# Patient Record
Sex: Female | Born: 1990
Health system: Southern US, Community
[De-identification: ages and names within clinical notes are randomized; demographics above are authoritative.]

## PROBLEM LIST (undated history)

## (undated) ENCOUNTER — Inpatient Hospital Stay (HOSPITAL_COMMUNITY): Payer: Self-pay

## (undated) DIAGNOSIS — F419 Anxiety disorder, unspecified: Secondary | ICD-10-CM

## (undated) DIAGNOSIS — F322 Major depressive disorder, single episode, severe without psychotic features: Secondary | ICD-10-CM

## (undated) DIAGNOSIS — K219 Gastro-esophageal reflux disease without esophagitis: Secondary | ICD-10-CM

## (undated) HISTORY — PX: NO PAST SURGERIES: SHX2092

---

## 2014-08-19 ENCOUNTER — Emergency Department (HOSPITAL_COMMUNITY)
Admission: EM | Admit: 2014-08-19 | Discharge: 2014-08-20 | Disposition: A | Discharge status: Home / Self Care | Payer: Medicaid Other | Attending: Emergency Medicine | Admitting: Emergency Medicine

## 2014-08-19 ENCOUNTER — Encounter (HOSPITAL_COMMUNITY): Payer: Self-pay | Admitting: Emergency Medicine

## 2014-08-19 DIAGNOSIS — Y92410 Unspecified street and highway as the place of occurrence of the external cause: Secondary | ICD-10-CM | POA: Insufficient documentation

## 2014-08-19 DIAGNOSIS — Z3202 Encounter for pregnancy test, result negative: Secondary | ICD-10-CM | POA: Diagnosis not present

## 2014-08-19 DIAGNOSIS — Y9389 Activity, other specified: Secondary | ICD-10-CM | POA: Insufficient documentation

## 2014-08-19 DIAGNOSIS — S29002A Unspecified injury of muscle and tendon of back wall of thorax, initial encounter: Secondary | ICD-10-CM | POA: Insufficient documentation

## 2014-08-19 DIAGNOSIS — Y998 Other external cause status: Secondary | ICD-10-CM | POA: Diagnosis not present

## 2014-08-19 HISTORY — DX: Major depressive disorder, single episode, severe without psychotic features: F32.2

## 2014-08-19 HISTORY — DX: Anxiety disorder, unspecified: F41.9

## 2014-08-19 LAB — POC URINE PREG, ED: PREG TEST UR: NEGATIVE

## 2014-08-19 MED ORDER — IBUPROFEN 400 MG PO TABS
800.0000 mg | ORAL_TABLET | Freq: Once | ORAL | Status: AC
  Administered 2014-08-19: 800 mg via ORAL
  Filled 2014-08-19: qty 2
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
## 2014-08-20 ENCOUNTER — Emergency Department (HOSPITAL_COMMUNITY): Payer: Medicaid Other

## 2014-08-20 MED ORDER — IBUPROFEN 800 MG PO TABS: 800.0000 mg | ORAL_TABLET | Freq: Three times a day (TID) | ORAL | Status: DC

## 2014-08-20 MED ORDER — METHOCARBAMOL 500 MG PO TABS: 500.0000 mg | ORAL_TABLET | Freq: Two times a day (BID) | ORAL | Status: DC

## 2014-08-20 NOTE — Discharge Instructions (Signed)

## 2014-12-22 ENCOUNTER — Inpatient Hospital Stay (HOSPITAL_COMMUNITY): Payer: Medicaid Other

## 2014-12-22 ENCOUNTER — Inpatient Hospital Stay (HOSPITAL_COMMUNITY)
Admission: AD | Admit: 2014-12-22 | Discharge: 2014-12-22 | Disposition: A | Discharge status: Home / Self Care | Payer: Medicaid Other | Source: Ambulatory Visit | Attending: Family Medicine | Admitting: Family Medicine

## 2014-12-22 DIAGNOSIS — R1030 Lower abdominal pain, unspecified: Secondary | ICD-10-CM | POA: Insufficient documentation

## 2014-12-22 DIAGNOSIS — O210 Mild hyperemesis gravidarum: Secondary | ICD-10-CM | POA: Insufficient documentation

## 2014-12-22 DIAGNOSIS — O4691 Antepartum hemorrhage, unspecified, first trimester: Secondary | ICD-10-CM | POA: Diagnosis not present

## 2014-12-22 DIAGNOSIS — Z3A09 9 weeks gestation of pregnancy: Secondary | ICD-10-CM | POA: Insufficient documentation

## 2014-12-22 DIAGNOSIS — O209 Hemorrhage in early pregnancy, unspecified: Secondary | ICD-10-CM

## 2014-12-22 LAB — URINE MICROSCOPIC-ADD ON

## 2014-12-22 LAB — URINALYSIS, ROUTINE W REFLEX MICROSCOPIC
Bilirubin Urine: NEGATIVE
GLUCOSE, UA: NEGATIVE mg/dL
Hgb urine dipstick: NEGATIVE
Ketones, ur: NEGATIVE mg/dL
Nitrite: NEGATIVE
PH: 5.5 (ref 5.0–8.0)
Protein, ur: NEGATIVE mg/dL
Specific Gravity, Urine: 1.025 (ref 1.005–1.030)
Urobilinogen, UA: 0.2 mg/dL (ref 0.0–1.0)

## 2014-12-22 LAB — CBC
HCT: 37.1 % (ref 36.0–46.0)
Hemoglobin: 12.9 g/dL (ref 12.0–15.0)
MCH: 32.1 pg (ref 26.0–34.0)
MCHC: 34.8 g/dL (ref 30.0–36.0)
MCV: 92.3 fL (ref 78.0–100.0)
Platelets: 269 10*3/uL (ref 150–400)
RBC: 4.02 MIL/uL (ref 3.87–5.11)
RDW: 13 % (ref 11.5–15.5)
WBC: 7.5 10*3/uL (ref 4.0–10.5)

## 2014-12-22 LAB — POCT PREGNANCY, URINE: Preg Test, Ur: POSITIVE — AB

## 2014-12-22 LAB — HCG, QUANTITATIVE, PREGNANCY: hCG, Beta Chain, Quant, S: 232 m[IU]/mL — ABNORMAL HIGH (ref <5)

## 2014-12-22 LAB — ABO/RH: ABO/RH(D): O POS

## 2014-12-22 NOTE — MAU Provider Note (Signed)
History     CSN: [id number]  Arrival date and time: 12/22/14 1247   None     Chief Complaint  Patient presents with  . Vaginal Bleeding  . Abdominal Pain   HPI This is a 24 y.o. female at [redacted]w[redacted]d by LMP who presents with c/o irregular cycles, possible + UPT at home, nausea, vomiting and one episode of bleeding a week ago. N/V is present daily. Intermittent. Has had dull lower abdominal pain for 3 days. Does not radiate.   RN Note:  Expand All Collapse All   LMP end of January, hx of irregular periods. Did HPT, thinks it was positive but not sure.  C/O N&V, had episode of bleeding last week - doesn't think it was her period, has light bleeding today. Lower abd pain x 3 days.           OB History    Gravida Para Term Preterm AB TAB SAB Ectopic Multiple Living   1               Past Medical History  Diagnosis Date  . Anxiety   . Severe depression     No past surgical history on file.  No family history on file.  History  Substance Use Topics  . Smoking status: Never Smoker   . Smokeless tobacco: Not on file  . Alcohol Use: No    Allergies: No Known Allergies  Prescriptions prior to admission  Medication Sig Dispense Refill Last Dose  . ibuprofen (ADVIL,MOTRIN) 800 MG tablet Take 1 tablet (800 mg total) by mouth 3 (three) times daily. 21 tablet 0   . methocarbamol (ROBAXIN) 500 MG tablet Take 1 tablet (500 mg total) by mouth 2 (two) times daily. 20 tablet 0     Review of Systems  Constitutional: Negative for fever, chills and malaise/fatigue.  Gastrointestinal: Positive for nausea, vomiting and abdominal pain. Negative for diarrhea and constipation.  Genitourinary: Negative for dysuria.       Vaginal bleeding   Neurological: Negative for dizziness, weakness and headaches.  Psychiatric/Behavioral: Positive for depression.   Physical Exam   Blood pressure 118/80, pulse 70, temperature 97.5 F (36.4 C), temperature source Oral, resp. rate 18, height 4'  11" (1.499 m), weight 128 lb 9.6 oz (58.333 kg), last menstrual period 10/17/2014.  Physical Exam  Constitutional: She is oriented to person, place, and time. She appears well-developed and well-nourished. No distress.  HENT:  Head: Normocephalic.  Cardiovascular: Normal rate.   Respiratory: Effort normal.  GI: Soft. She exhibits no distension. There is no tenderness. There is no rebound and no guarding.  Genitourinary:  Would not consent to pelvic exam today. Agrees to "maybe" do it when she returns for next Quant HCG level  Musculoskeletal: Normal range of motion.  Neurological: She is alert and oriented to person, place, and time.  Skin: Skin is warm and dry.  Psychiatric: She has a normal mood and affect.   Results for orders placed or performed during the hospital encounter of 12/22/14 (from the past 72 hour(s))  Urinalysis, Routine w reflex microscopic     Status: Abnormal   Collection Time: 12/22/14  1:55 PM  Result Value Ref Range   Color, Urine YELLOW YELLOW   APPearance CLEAR CLEAR   Specific Gravity, Urine 1.025 1.005 - 1.030   pH 5.5 5.0 - 8.0   Glucose, UA NEGATIVE NEGATIVE mg/dL   Hgb urine dipstick NEGATIVE NEGATIVE   Bilirubin Urine NEGATIVE NEGATIVE   Ketones, ur  NEGATIVE NEGATIVE mg/dL   Protein, ur NEGATIVE NEGATIVE mg/dL   Urobilinogen, UA 0.2 0.0 - 1.0 mg/dL   Nitrite NEGATIVE NEGATIVE   Leukocytes, UA SMALL (A) NEGATIVE  Urine microscopic-add on     Status: Abnormal   Collection Time: 12/22/14  1:55 PM  Result Value Ref Range   Squamous Epithelial / LPF FEW (A) RARE   WBC, UA 0-2 <3 WBC/hpf   Bacteria, UA RARE RARE  Pregnancy, urine POC     Status: Abnormal   Collection Time: 12/22/14  2:06 PM  Result Value Ref Range   Preg Test, Ur POSITIVE (A) NEGATIVE    Comment:        THE SENSITIVITY OF THIS METHODOLOGY IS >24 mIU/mL   CBC     Status: None   Collection Time: 12/22/14  3:30 PM  Result Value Ref Range   WBC 7.5 4.0 - 10.5 K/uL   RBC 4.02 3.87  - 5.11 MIL/uL   Hemoglobin 12.9 12.0 - 15.0 g/dL   HCT 16.137.1 09.636.0 - 04.546.0 %   MCV 92.3 78.0 - 100.0 fL   MCH 32.1 26.0 - 34.0 pg   MCHC 34.8 30.0 - 36.0 g/dL   RDW 40.913.0 81.111.5 - 91.415.5 %   Platelets 269 150 - 400 K/uL  hCG, quantitative, pregnancy     Status: Abnormal   Collection Time: 12/22/14  3:30 PM  Result Value Ref Range   hCG, Beta Chain, Quant, S 232 (H) <5 mIU/mL    Comment:          GEST. AGE      CONC.  (mIU/mL)   <=1 WEEK        5 - 50     2 WEEKS       50 - 500     3 WEEKS       100 - 10,000     4 WEEKS     1,000 - 30,000     5 WEEKS     3,500 - 115,000   6-8 WEEKS     12,000 - 270,000    12 WEEKS     15,000 - 220,000        FEMALE AND NON-PREGNANT FEMALE:     LESS THAN 5 mIU/mL   ABO/Rh     Status: None   Collection Time: 12/22/14  3:30 PM  Result Value Ref Range   ABO/RH(D) O POS     MAU Course  Procedures  MDM Discussed process of ruling out ectopic pregnancy. Discussed need to do pelvic exam., refuses to do it today. Wants to leave. Encouraged to return in 2 days for repeat HCG  Assessment and Plan  A:  Pregnancy at 8127w4d       Abdominal pain      Bleeding episode a week ago      Nausea and vomiting  P:  States she Will return Thursday for repeat quant and pelvic exam   Medication List    STOP taking these medications        ibuprofen 800 MG tablet  Commonly known as:  ADVIL,MOTRIN     methocarbamol 500 MG tablet  Commonly known as:  ROBAXIN               Ectopic precautions.   Wynelle BourgeoisWILLIAMS,Jolleen Seman 12/22/2014, 5:14 PM

## 2014-12-22 NOTE — MAU Note (Signed)
LMP end of January, hx of irregular periods.  Did HPT, thinks it was positive but not sure.   C/O N&V, had episode of bleeding last week - doesn't think it was her period, has light bleeding today.  Lower abd pain x 3 days.

## 2014-12-22 NOTE — Discharge Instructions (Signed)
Pelvic Rest °Pelvic rest is sometimes recommended for women when:  °· The placenta is partially or completely covering the opening of the cervix (placenta previa). °· There is bleeding between the uterine wall and the amniotic sac in the first trimester (subchorionic hemorrhage). °· The cervix begins to open without labor starting (incompetent cervix, cervical insufficiency). °· The labor is too early (preterm labor). °HOME CARE INSTRUCTIONS °· Do not have sexual intercourse, stimulation, or an orgasm. °· Do not use tampons, douche, or put anything in the vagina. °· Do not lift anything over 10 pounds (4.5 kg). °· Avoid strenuous activity or straining your pelvic muscles. °SEEK MEDICAL CARE IF:  °· You have any vaginal bleeding during pregnancy. Treat this as a potential emergency. °· You have cramping pain felt low in the stomach (stronger than menstrual cramps). °· You notice vaginal discharge (watery, mucus, or bloody). °· You have a low, dull backache. °· There are regular contractions or uterine tightening. °SEEK IMMEDIATE MEDICAL CARE IF: °You have vaginal bleeding and have placenta previa.  °Document Released: 12/30/2010 Document Revised: 11/27/2011 Document Reviewed: 12/30/2010 °ExitCare® Patient Information ©2015 [employer]. This information is not intended to replace advice given to you by your health care provider. Make sure you discuss any questions you have with your health care provider. ° °Vaginal Bleeding During Pregnancy, First Trimester °A small amount of bleeding (spotting) from the vagina is common in early pregnancy. Sometimes the bleeding is normal and is not a problem, and sometimes it is a sign of something serious. Be sure to tell your doctor about any bleeding from your vagina right away. °HOME CARE °· Watch your condition for any changes. °· Follow your doctor's instructions about how active you can be. °· If you are on bed rest: °¨ You may need to stay in bed and only get up to use the  bathroom. °¨ You may be allowed to do some activities. °¨ If you need help, make plans for someone to help you. °· Write down: °¨ The number of pads you use each day. °¨ How often you change pads. °¨ How soaked (saturated) your pads are. °· Do not use tampons. °· Do not douche. °· Do not have sex or orgasms until your doctor says it is okay. °· If you pass any tissue from your vagina, save the tissue so you can show it to your doctor. °· Only take medicines as told by your doctor. °· Do not take aspirin because it can make you bleed. °· Keep all follow-up visits as told by your doctor. °GET HELP IF:  °· You bleed from your vagina. °· You have cramps. °· You have labor pains. °· You have a fever that does not go away after you take medicine. °GET HELP RIGHT AWAY IF:  °· You have very bad cramps in your back or belly (abdomen). °· You pass large clots or tissue from your vagina. °· You bleed more. °· You feel light-headed or weak. °· You pass out (faint). °· You have chills. °· You are leaking fluid or have a gush of fluid from your vagina. °· You pass out while pooping (having a bowel movement). °MAKE SURE YOU: °· Understand these instructions. °· Will watch your condition. °· Will get help right away if you are not doing well or get worse. °Document Released: 01/19/2014 Document Reviewed: 05/12/2013 °ExitCare® Patient Information ©2015 [employer]. This information is not intended to replace advice given to you by your health care provider. Make   sure you discuss any questions you have with your health care provider. ° °

## 2014-12-23 ENCOUNTER — Encounter (HOSPITAL_COMMUNITY): Payer: Self-pay | Admitting: Advanced Practice Midwife

## 2014-12-25 ENCOUNTER — Telehealth (HOSPITAL_COMMUNITY): Payer: Self-pay | Admitting: Emergency Medicine

## 2014-12-25 NOTE — Telephone Encounter (Signed)
Spoke with pt regarding follow up and missed appt.  ? Whether she needed to return, has to work, may come on Monday. Informed pt we are open 24/7, just want to make sure she is ok.

## 2015-10-28 ENCOUNTER — Encounter (HOSPITAL_COMMUNITY): Payer: Self-pay | Admitting: *Deleted

## 2016-01-15 ENCOUNTER — Encounter (HOSPITAL_COMMUNITY): Payer: Self-pay

## 2016-01-15 ENCOUNTER — Ambulatory Visit (HOSPITAL_COMMUNITY)
Admission: EM | Admit: 2016-01-15 | Discharge: 2016-01-15 | Disposition: A | Discharge status: Home / Self Care | Payer: Medicaid Other | Attending: Emergency Medicine | Admitting: Emergency Medicine

## 2016-01-15 DIAGNOSIS — T7840XA Allergy, unspecified, initial encounter: Secondary | ICD-10-CM | POA: Diagnosis not present

## 2016-01-15 DIAGNOSIS — L509 Urticaria, unspecified: Secondary | ICD-10-CM

## 2016-01-15 MED ORDER — PREDNISONE 50 MG PO TABS
ORAL_TABLET | ORAL | Status: DC
Start: 2016-01-15 — End: 2016-09-17

## 2016-01-15 MED ORDER — METHYLPREDNISOLONE SODIUM SUCC 125 MG IJ SOLR
125.0000 mg | Freq: Once | INTRAMUSCULAR | Status: AC
  Administered 2016-01-15: 125 mg via INTRAMUSCULAR

## 2016-01-15 MED ORDER — METHYLPREDNISOLONE SODIUM SUCC 125 MG IJ SOLR
INTRAMUSCULAR | Status: AC
  Filled 2016-01-15: qty 2

## 2016-01-15 NOTE — ED Provider Notes (Signed)
CSN: [id number]     Arrival date & time 01/15/16  1840 History   First MD Initiated Contact with Patient 01/15/16 1906     Chief Complaint  Patient presents with  . Rash   (Consider location/radiation/quality/duration/timing/severity/associated sxs/prior Treatment) HPI  He is a 25 year old woman here for evaluation of rash. She states around 4:00 this afternoon she developed itching on her right posterior shoulder. This spread to her arms, chest, and back. She reports getting hives that have since gone down. She has taken Benadryl with some improvement. She continues to feel very itchy. No known new exposures.  Past Medical History  Diagnosis Date  . Anxiety   . Severe depression    History reviewed. No pertinent past surgical history. No family history on file. Social History  Substance Use Topics  . Smoking status: Current Every Day Smoker -- 1.00 packs/day    Types: Cigarettes  . Smokeless tobacco: Never Used  . Alcohol Use: No   OB History    Gravida Para Term Preterm AB TAB SAB Ectopic Multiple Living   1              Review of Systems As in history of present illness Allergies  Review of patient's allergies indicates no known allergies.  Home Medications   Prior to Admission medications   Medication Sig Start Date End Date Taking? Authorizing Provider  predniSONE (DELTASONE) 50 MG tablet Take 1 pill daily for 5 days. 01/15/16   Charm RingsErin J Jag Lenz, MD   Meds Ordered and Administered this Visit   Medications  methylPREDNISolone sodium succinate (SOLU-MEDROL) 125 mg/2 mL injection 125 mg (125 mg Intramuscular Given 01/15/16 1950)    BP 109/69 mmHg  Pulse 74  Temp(Src) 97.9 F (36.6 C) (Oral)  SpO2 100% No data found.   Physical Exam  Constitutional: She is oriented to person, place, and time. She appears well-developed and well-nourished. No distress.  HENT:  Mouth/Throat: Oropharynx is clear and moist. No oropharyngeal exudate.  Cardiovascular: Normal rate,  regular rhythm and normal heart sounds.   No murmur heard. Pulmonary/Chest: Effort normal and breath sounds normal. No respiratory distress. She has no wheezes. She has no rales.  Neurological: She is alert and oriented to person, place, and time.  Skin:  She does have an urticarial type rash on her back.    ED Course  Procedures (including critical care time)  Labs Review Labs Reviewed - No data to display  Imaging Review No results found.   MDM   1. Urticaria   2. Allergic reaction, initial encounter    Unclear trigger. Solu-Medrol given here. Treat with prednisone and OTC allergy medicine. Benadryl as needed for itching. Follow-up as needed.    Charm RingsErin J Kimmerly Lora, MD 01/15/16 [phone]

## 2016-01-15 NOTE — ED Notes (Signed)
Patient states she broke out in a rash all over both arms and her back today at work, she is not sure what she came into contact with, thinks she may be allergic to fish.  Patient used benadryl spray and itching stopped. No acute distress

## 2016-01-15 NOTE — Discharge Instructions (Signed)
You are having a reaction to something, I'm not sure what, but I don't think it's the fish. We gave you a shot today. Start the prednisone tomorrow. Take Zyrtec or Claritin daily for the next week. You can use Benadryl every 4 hours as needed for itching. Follow-up as needed.

## 2016-06-21 ENCOUNTER — Emergency Department (HOSPITAL_COMMUNITY)
Admission: EM | Admit: 2016-06-21 | Discharge: 2016-06-22 | Disposition: A | Discharge status: Home / Self Care | Payer: Medicaid Other | Attending: Emergency Medicine | Admitting: Emergency Medicine

## 2016-06-21 ENCOUNTER — Encounter (HOSPITAL_COMMUNITY): Payer: Self-pay | Admitting: *Deleted

## 2016-06-21 ENCOUNTER — Encounter (HOSPITAL_COMMUNITY): Payer: Self-pay | Admitting: Emergency Medicine

## 2016-06-21 ENCOUNTER — Ambulatory Visit (HOSPITAL_COMMUNITY)
Admission: EM | Admit: 2016-06-21 | Discharge: 2016-06-21 | Disposition: A | Discharge status: Home / Self Care | Payer: Medicaid Other | Attending: Family Medicine | Admitting: Family Medicine

## 2016-06-21 DIAGNOSIS — F17210 Nicotine dependence, cigarettes, uncomplicated: Secondary | ICD-10-CM | POA: Diagnosis not present

## 2016-06-21 DIAGNOSIS — R52 Pain, unspecified: Secondary | ICD-10-CM | POA: Diagnosis not present

## 2016-06-21 DIAGNOSIS — R1084 Generalized abdominal pain: Secondary | ICD-10-CM

## 2016-06-21 DIAGNOSIS — Z79899 Other long term (current) drug therapy: Secondary | ICD-10-CM | POA: Insufficient documentation

## 2016-06-21 DIAGNOSIS — R110 Nausea: Secondary | ICD-10-CM

## 2016-06-21 DIAGNOSIS — R21 Rash and other nonspecific skin eruption: Secondary | ICD-10-CM | POA: Diagnosis not present

## 2016-06-21 DIAGNOSIS — B019 Varicella without complication: Secondary | ICD-10-CM | POA: Diagnosis not present

## 2016-06-21 LAB — COMPREHENSIVE METABOLIC PANEL
ALBUMIN: 3.9 g/dL (ref 3.5–5.0)
ALT: 25 U/L (ref 14–54)
AST: 24 U/L (ref 15–41)
Alkaline Phosphatase: 69 U/L (ref 38–126)
Anion gap: 7 (ref 5–15)
BILIRUBIN TOTAL: 0.5 mg/dL (ref 0.3–1.2)
BUN: 8 mg/dL (ref 6–20)
CO2: 26 mmol/L (ref 22–32)
Calcium: 9.2 mg/dL (ref 8.9–10.3)
Chloride: 103 mmol/L (ref 101–111)
Creatinine, Ser: 0.86 mg/dL (ref 0.44–1.00)
GFR calc Af Amer: >60 mL/min (ref >60)
GFR calc non Af Amer: >60 mL/min (ref >60)
GLUCOSE: 96 mg/dL (ref 65–99)
POTASSIUM: 3.9 mmol/L (ref 3.5–5.1)
SODIUM: 136 mmol/L (ref 135–145)
TOTAL PROTEIN: 6.1 g/dL — AB (ref 6.5–8.1)

## 2016-06-21 LAB — CBC
HEMATOCRIT: 37.7 % (ref 36.0–46.0)
HEMOGLOBIN: 12.7 g/dL (ref 12.0–15.0)
MCH: 31.4 pg (ref 26.0–34.0)
MCHC: 33.7 g/dL (ref 30.0–36.0)
MCV: 93.3 fL (ref 78.0–100.0)
Platelets: 225 10*3/uL (ref 150–400)
RBC: 4.04 MIL/uL (ref 3.87–5.11)
RDW: 12.9 % (ref 11.5–15.5)
WBC: 3.6 10*3/uL — ABNORMAL LOW (ref 4.0–10.5)

## 2016-06-21 LAB — URINALYSIS, ROUTINE W REFLEX MICROSCOPIC
GLUCOSE, UA: NEGATIVE mg/dL
HGB URINE DIPSTICK: NEGATIVE
KETONES UR: 15 mg/dL — AB
Leukocytes, UA: NEGATIVE
NITRITE: NEGATIVE
PH: 7 (ref 5.0–8.0)
Protein, ur: NEGATIVE mg/dL
SPECIFIC GRAVITY, URINE: 1.043 — AB (ref 1.005–1.030)

## 2016-06-21 LAB — POCT URINALYSIS DIP (DEVICE)
Glucose, UA: NEGATIVE mg/dL
HGB URINE DIPSTICK: NEGATIVE
KETONES UR: 40 mg/dL — AB
Leukocytes, UA: NEGATIVE
NITRITE: NEGATIVE
Protein, ur: 30 mg/dL — AB
Specific Gravity, Urine: 1.025 (ref 1.005–1.030)
Urobilinogen, UA: 2 mg/dL — ABNORMAL HIGH (ref 0.0–1.0)
pH: 6 (ref 5.0–8.0)

## 2016-06-21 LAB — POC URINE PREG, ED: Preg Test, Ur: NEGATIVE

## 2016-06-21 LAB — LIPASE, BLOOD: Lipase: 20 U/L (ref 11–51)

## 2016-06-21 LAB — POCT PREGNANCY, URINE: Preg Test, Ur: NEGATIVE

## 2016-06-21 MED ORDER — TRIAMCINOLONE ACETONIDE 0.1 % EX CREA: 1.0000 "application " | TOPICAL_CREAM | Freq: Two times a day (BID) | CUTANEOUS | 0 refills | Status: AC

## 2016-06-21 MED ORDER — ONDANSETRON 4 MG PO TBDP: 4.0000 mg | ORAL_TABLET | Freq: Three times a day (TID) | ORAL | 0 refills | Status: AC | PRN

## 2016-06-21 MED ORDER — ONDANSETRON 4 MG PO TBDP
4.0000 mg | ORAL_TABLET | Freq: Once | ORAL | Status: AC
  Administered 2016-06-21: 4 mg via ORAL

## 2016-06-21 MED ORDER — ONDANSETRON 4 MG PO TBDP
ORAL_TABLET | ORAL | Status: AC
  Filled 2016-06-21: qty 1

## 2016-06-21 NOTE — ED Provider Notes (Signed)
CSN: [id number]     Arrival date & time 06/21/16  1223 History   First MD Initiated Contact with Patient 06/21/16 1419     Chief Complaint  Patient presents with  . Abdominal Pain   (Consider location/radiation/quality/duration/timing/severity/associated sxs/prior Treatment) Kelsey Cooper is a well-appearing 25 y.o female, presents today with multiple complaints. She started feeling sick 2 days ago and reports not able to eat and haven't been eating anything for the past 2 days. She is able to keep fluid down. Today she started to get worst with body ache, hurting everywhere including her back and her flanks, with chills, nausea and vomited x 1 episode today. Emesis contains food content (crackers that she just ate). Emesis non-bloody, non-bilious, and non-projectile. She also noticed several small bumps on her body. She reports to have intermittent throbbing abdominal pain locates at the mid abdomen and eventually traveled to her private area and reports that this pain went away and is currently having no abd pain. She reports no vaginal discharge. She reports no fever. She reports no dysuria. She is sexually active. LMP: finished 2 days ago. LBM: yesterday and denies constipation. She is passing gas.       Past Medical History:  Diagnosis Date  . Anxiety   . Severe depression (HCC)    History reviewed. No pertinent surgical history. History reviewed. No pertinent family history. Social History  Substance Use Topics  . Smoking status: Current Every Day Smoker    Packs/day: 1.00    Types: Cigarettes  . Smokeless tobacco: Never Used  . Alcohol use No   OB History    Gravida Para Term Preterm AB Living   1             SAB TAB Ectopic Multiple Live Births                 Review of Systems  Constitutional: Negative for appetite change, chills, fatigue and fever.  HENT: Negative for congestion, rhinorrhea, sinus pressure and sore throat.   Respiratory: Negative for cough and shortness  of breath.   Cardiovascular: Negative for chest pain and palpitations.  Gastrointestinal: Positive for abdominal pain, nausea and vomiting. Negative for blood in stool, constipation and diarrhea.       Abdominal pain has subsidized  Genitourinary: Positive for flank pain. Negative for dysuria and vaginal discharge.  Musculoskeletal: Positive for myalgias.  Skin: Positive for rash.  Neurological: Negative for dizziness, weakness and headaches.    Allergies  Review of patient's allergies indicates no known allergies.  Home Medications   Prior to Admission medications   Medication Sig Start Date End Date Taking? Authorizing Provider  ondansetron (ZOFRAN ODT) 4 MG disintegrating tablet Take 1 tablet (4 mg total) by mouth every 8 (eight) hours as needed for nausea or vomiting. 06/21/16 06/24/16  Lucia Estelle, NP  predniSONE (DELTASONE) 50 MG tablet Take 1 pill daily for 5 days. 01/15/16   Charm Rings, MD  triamcinolone cream (KENALOG) 0.1 % Apply 1 application topically 2 (two) times daily. 06/21/16 06/26/16  Lucia Estelle, NP   Meds Ordered and Administered this Visit   Medications  ondansetron (ZOFRAN-ODT) disintegrating tablet 4 mg (4 mg Oral Given 06/21/16 1417)    BP 114/63 (BP Location: Left Arm)   Pulse 70   Temp 98 F (36.7 C) (Oral)   Resp 12   LMP 06/19/2016 (Exact Date)   SpO2 100%  No data found.   Physical Exam  Constitutional: She is oriented  to person, place, and time. She appears well-developed and well-nourished.  HENT:  Head: Normocephalic and atraumatic.  Right Ear: External ear normal.  Left Ear: External ear normal.  Nose: Nose normal.  Mouth/Throat: Oropharynx is clear and moist. No oropharyngeal exudate.  Eyes: EOM are normal. Pupils are equal, round, and reactive to light.  Neck: Normal range of motion. Neck supple.  Cardiovascular: Normal rate, regular rhythm and normal heart sounds.   Pulmonary/Chest: Effort normal and breath sounds normal. No respiratory  distress.  Abdominal: Soft. Bowel sounds are normal. She exhibits no distension and no mass. There is no tenderness.  Musculoskeletal: Normal range of motion. She exhibits no edema or deformity.  Lymphadenopathy:    She has no cervical adenopathy.  Neurological: She is alert and oriented to person, place, and time.  Skin: Skin is warm and dry.  Has several discrete and scattered erythematous vesicles, one noted on forehead, one noted on right breast, two noted on her left upper thigh near the groin. Noticed the rash today. Rash non-bothersome, no pain, no itchiness. See picture below  Psychiatric: She has a normal mood and affect.  Nursing note and vitals reviewed.         Urgent Care Course   Clinical Course    Procedures (including critical care time)  Labs Review Labs Reviewed  POCT URINALYSIS DIP (DEVICE) - Abnormal; Notable for the following:       Result Value   Bilirubin Urine SMALL (*)    Ketones, ur 40 (*)    Protein, ur 30 (*)    Urobilinogen, UA 2.0 (*)    All other components within normal limits  POCT PREGNANCY, URINE    Imaging Review No results found.    MDM   1. Nausea   2. Generalized abdominal pain   3. Body aches   4. Rash    UA negative for nitrite and leukocytes but pos for bilirubin, ketones, protein and urobilinogen; informed to follow up with PCP in 1-2 weeks to have urine recheck. Informed that her symptoms today are very unspecific cause is unknown but could be viral, however I am not concern for an emergency causes. Rash believes to be cause by a separate etiology. Zofran given for nausea. Topical steroid cream given for the rash. Supervising physician consulted for this visit. Instructed to must f/u with PCP.    Lucia EstelleFeng Glendi Mohiuddin, NP 06/21/16 1529

## 2016-06-21 NOTE — Discharge Instructions (Signed)
The cause of your symptoms is unknown. We believe you are safe to be discharge home. Please take Zofran as needed for your  nausea. Start a soft bland diet to help as well. Apply the cream on rash twice daily for 5 days. Follow up with your primary care provider in 1-2 weeks for your urine to be recheck. I provided you a copy of your urine result so you can take it to your primary care doctor.

## 2016-06-21 NOTE — ED Triage Notes (Signed)
[facility] with multiple complaints. The patient stated that she has been having suprapubic pain along with chills, body aches, and a loss of appetite for 2 days.   The patient also reported a rash on her face and head.

## 2016-06-21 NOTE — ED Triage Notes (Signed)
The pt is c/o  Generalized body aches chills and sweats for 3 days back pain  Rash.  She denies urnary symptoms  [facility] and was told that they could not find anything.  She reports that she cannot eat also  lmp 2 days ago  No pain or discomfort at present

## 2016-06-22 MED ORDER — ACYCLOVIR 400 MG PO TABS: 800.0000 mg | ORAL_TABLET | Freq: Every day | ORAL | 0 refills | Status: DC

## 2016-06-22 MED ORDER — PROMETHAZINE HCL 25 MG PO TABS: 25.0000 mg | ORAL_TABLET | Freq: Three times a day (TID) | ORAL | 0 refills | Status: DC | PRN

## 2016-06-22 NOTE — ED Provider Notes (Signed)
[facility]-EMERGENCY DEPT Provider Note   CSN: [id number] Arrival date & time: 06/21/16  1958     History   Chief Complaint Chief Complaint  Patient presents with  . Generalized Body Aches    HPI Kelsey Cooper is a 25 y.o. female.  The history is provided by the patient. No language interpreter was used.   Kelsey Cooper is a 25 y.o. female who presents to the Emergency Department complaining of body aches.  She reports 3 days of generalized body aches with intermittent abdominal pain, nausea, back pain. She reports subjective fevers at home with decreased oral intake and some vomiting that started today. No dysuria, vaginal discharge, diarrhea. She noted bumpy rash today with minimal itching. She recently found out that a coworker was diagnosed with chickenpox and is out of work. No cough or shortness of breath.  Past Medical History:  Diagnosis Date  . Anxiety   . Severe depression (HCC)     There are no active problems to display for this patient.   History reviewed. No pertinent surgical history.  OB History    Gravida Para Term Preterm AB Living   1             SAB TAB Ectopic Multiple Live Births                   Home Medications    Prior to Admission medications   Medication Sig Start Date End Date Taking? Authorizing Provider  acyclovir (ZOVIRAX) 400 MG tablet Take 2 tablets (800 mg total) by mouth 5 (five) times daily. 06/22/16   Tilden Fossa, MD  ondansetron (ZOFRAN ODT) 4 MG disintegrating tablet Take 1 tablet (4 mg total) by mouth every 8 (eight) hours as needed for nausea or vomiting. 06/21/16 06/24/16  Lucia Estelle, NP  predniSONE (DELTASONE) 50 MG tablet Take 1 pill daily for 5 days. 01/15/16   Charm Rings, MD  promethazine (PHENERGAN) 25 MG tablet Take 1 tablet (25 mg total) by mouth every 8 (eight) hours as needed for nausea or vomiting. 06/22/16   Tilden Fossa, MD  triamcinolone cream (KENALOG) 0.1 % Apply 1 application topically 2 (two) times daily.  06/21/16 06/26/16  Lucia Estelle, NP    Family History No family history on file.  Social History Social History  Substance Use Topics  . Smoking status: Current Every Day Smoker    Packs/day: 1.00    Types: Cigarettes  . Smokeless tobacco: Never Used  . Alcohol use No     Allergies   Review of patient's allergies indicates no known allergies.   Review of Systems Review of Systems  All other systems reviewed and are negative.    Physical Exam Updated Vital Signs BP 122/61 (BP Location: Right Arm)   Pulse 82   Temp 98.7 F (37.1 C) (Oral)   Resp 18   LMP 06/19/2016 (Exact Date)   SpO2 99%   Physical Exam  Constitutional: She is oriented to person, place, and time. She appears well-developed and well-nourished.  HENT:  Head: Normocephalic and atraumatic.  Cardiovascular: Normal rate and regular rhythm.   No murmur heard. Pulmonary/Chest: Effort normal and breath sounds normal. No respiratory distress.  Abdominal: Soft. There is no tenderness. There is no rebound and no guarding.  Musculoskeletal: She exhibits no edema or tenderness.  Neurological: She is alert and oriented to person, place, and time.  Skin: Skin is warm and dry.  Vesicular rash in various stages on head, right neck, back,  chest and abdomen, upper legs.  Psychiatric: She has a normal mood and affect. Her behavior is normal.  Nursing note and vitals reviewed.    ED Treatments / Results  Labs (all labs ordered are listed, but only abnormal results are displayed) Labs Reviewed  COMPREHENSIVE METABOLIC PANEL - Abnormal; Notable for the following:       Result Value   Total Protein 6.1 (*)    All other components within normal limits  CBC - Abnormal; Notable for the following:    WBC 3.6 (*)    All other components within normal limits  URINALYSIS, ROUTINE W REFLEX MICROSCOPIC (NOT AT [facility]) - Abnormal; Notable for the following:    Color, Urine AMBER (*)    APPearance CLOUDY (*)    Specific  Gravity, Urine 1.043 (*)    Bilirubin Urine SMALL (*)    Ketones, ur 15 (*)    All other components within normal limits  LIPASE, BLOOD  POC URINE PREG, ED    EKG  EKG Interpretation None       Radiology No results found.  Procedures Procedures (including critical care time)  Medications Ordered in ED Medications - No data to display   Initial Impression / Assessment and Plan / ED Course  I have reviewed the triage vital signs and the nursing notes.  Pertinent labs & imaging results that were available during my care of the patient were reviewed by me and considered in my medical decision making (see chart for details).  Clinical Course    Patient here for evaluation of body aches, nausea, decreased oral intake. Examination is concerning for varicella infection. She is nontoxic appearing on examination. Discussed home care as well as infection control precautions. Discussed outpatient follow-up and return precautions.  Final Clinical Impressions(s) / ED Diagnoses   Final diagnoses:  Varicella without complication    New Prescriptions Discharge Medication List as of 06/22/2016 12:08 AM    START taking these medications   Details  acyclovir (ZOVIRAX) 400 MG tablet Take 2 tablets (800 mg total) by mouth 5 (five) times daily., Starting Thu 06/22/2016, Print    promethazine (PHENERGAN) 25 MG tablet Take 1 tablet (25 mg total) by mouth every 8 (eight) hours as needed for nausea or vomiting., Starting Thu 06/22/2016, Print         Tilden FossaElizabeth Edwyna Dangerfield, MD 06/22/16 0145

## 2016-09-17 ENCOUNTER — Inpatient Hospital Stay (HOSPITAL_COMMUNITY)
Admission: AD | Admit: 2016-09-17 | Discharge: 2016-09-17 | Disposition: A | Discharge status: Home / Self Care | Payer: Medicaid Other | Source: Ambulatory Visit | Attending: Obstetrics and Gynecology | Admitting: Obstetrics and Gynecology

## 2016-09-17 ENCOUNTER — Encounter (HOSPITAL_COMMUNITY): Payer: Self-pay

## 2016-09-17 DIAGNOSIS — Z3A01 Less than 8 weeks gestation of pregnancy: Secondary | ICD-10-CM | POA: Diagnosis not present

## 2016-09-17 DIAGNOSIS — R12 Heartburn: Secondary | ICD-10-CM | POA: Diagnosis present

## 2016-09-17 DIAGNOSIS — O219 Vomiting of pregnancy, unspecified: Secondary | ICD-10-CM | POA: Insufficient documentation

## 2016-09-17 DIAGNOSIS — O26891 Other specified pregnancy related conditions, first trimester: Secondary | ICD-10-CM

## 2016-09-17 DIAGNOSIS — Z87891 Personal history of nicotine dependence: Secondary | ICD-10-CM | POA: Insufficient documentation

## 2016-09-17 HISTORY — DX: Gastro-esophageal reflux disease without esophagitis: K21.9

## 2016-09-17 LAB — COMPREHENSIVE METABOLIC PANEL
ALBUMIN: 3.6 g/dL (ref 3.5–5.0)
ALT: 15 U/L (ref 14–54)
ANION GAP: 6 (ref 5–15)
AST: 13 U/L — ABNORMAL LOW (ref 15–41)
Alkaline Phosphatase: 47 U/L (ref 38–126)
BUN: 13 mg/dL (ref 6–20)
CO2: 24 mmol/L (ref 22–32)
Calcium: 8.5 mg/dL — ABNORMAL LOW (ref 8.9–10.3)
Chloride: 101 mmol/L (ref 101–111)
Creatinine, Ser: 0.59 mg/dL (ref 0.44–1.00)
GFR calc non Af Amer: >60 mL/min (ref >60)
GLUCOSE: 83 mg/dL (ref 65–99)
Potassium: 3.6 mmol/L (ref 3.5–5.1)
SODIUM: 131 mmol/L — AB (ref 135–145)
Total Bilirubin: 0.6 mg/dL (ref 0.3–1.2)
Total Protein: 5.8 g/dL — ABNORMAL LOW (ref 6.5–8.1)

## 2016-09-17 LAB — URINALYSIS, ROUTINE W REFLEX MICROSCOPIC
BILIRUBIN URINE: NEGATIVE
Glucose, UA: NEGATIVE mg/dL
HGB URINE DIPSTICK: NEGATIVE
Ketones, ur: 20 mg/dL — AB
NITRITE: NEGATIVE
PROTEIN: 30 mg/dL — AB
SPECIFIC GRAVITY, URINE: 1.028 (ref 1.005–1.030)
pH: 7 (ref 5.0–8.0)

## 2016-09-17 LAB — CBC
HEMATOCRIT: 36.7 % (ref 36.0–46.0)
HEMOGLOBIN: 13.1 g/dL (ref 12.0–15.0)
MCH: 31.9 pg (ref 26.0–34.0)
MCHC: 35.7 g/dL (ref 30.0–36.0)
MCV: 89.3 fL (ref 78.0–100.0)
Platelets: 310 10*3/uL (ref 150–400)
RBC: 4.11 MIL/uL (ref 3.87–5.11)
RDW: 12.8 % (ref 11.5–15.5)
WBC: 9.1 10*3/uL (ref 4.0–10.5)

## 2016-09-17 LAB — LIPASE, BLOOD: Lipase: 14 U/L (ref 11–51)

## 2016-09-17 LAB — POCT PREGNANCY, URINE: PREG TEST UR: POSITIVE — AB

## 2016-09-17 MED ORDER — PANTOPRAZOLE SODIUM 40 MG IV SOLR
40.0000 mg | Freq: Once | INTRAVENOUS | Status: AC
  Administered 2016-09-17: 40 mg via INTRAVENOUS
  Filled 2016-09-17: qty 40

## 2016-09-17 MED ORDER — LACTATED RINGERS IV BOLUS (SEPSIS)
1000.0000 mL | Freq: Once | INTRAVENOUS | Status: AC
  Administered 2016-09-17: 1000 mL via INTRAVENOUS

## 2016-09-17 MED ORDER — PROMETHAZINE HCL 25 MG/ML IJ SOLN
25.0000 mg | Freq: Once | INTRAMUSCULAR | Status: AC
  Administered 2016-09-17: 25 mg via INTRAVENOUS
  Filled 2016-09-17: qty 1

## 2016-09-17 MED ORDER — RANITIDINE HCL 150 MG PO TABS: 150.0000 mg | ORAL_TABLET | Freq: Two times a day (BID) | ORAL | 0 refills | Status: DC

## 2016-09-17 NOTE — MAU Note (Signed)
Patient just found out pregnant, diagnosed with GERD, can't keep anything down, has not eaten in 3 days, taking Pepcid

## 2016-09-17 NOTE — Discharge Instructions (Signed)
Food Choices for Gastroesophageal Reflux Disease, Adult When you have gastroesophageal reflux disease (GERD), the foods you eat and your eating habits are very important. Choosing the right foods can help ease your discomfort. What guidelines do I need to follow?  Choose fruits, vegetables, whole grains, and low-fat dairy products.  Choose low-fat meat, fish, and poultry.  Limit fats such as oils, salad dressings, butter, nuts, and avocado.  Keep a food diary. This helps you identify foods that cause symptoms.  Avoid foods that cause symptoms. These may be different for everyone.  Eat small meals often instead of 3 large meals a day.  Eat your meals slowly, in a place where you are relaxed.  Limit fried foods.  Cook foods using methods other than frying.  Avoid drinking alcohol.  Avoid drinking large amounts of liquids with your meals.  Avoid bending over or lying down until 2-3 hours after eating. What foods are not recommended? These are some foods and drinks that may make your symptoms worse: Vegetables  Tomatoes. Tomato juice. Tomato and spaghetti sauce. Chili peppers. Onion and garlic. Horseradish. Fruits  Oranges, grapefruit, and lemon (fruit and juice). Meats  High-fat meats, fish, and poultry. This includes hot dogs, ribs, ham, sausage, salami, and bacon. Dairy  Whole milk and chocolate milk. Sour cream. Cream. Butter. Ice cream. Cream cheese. Drinks  Coffee and tea. Bubbly (carbonated) drinks or energy drinks. Condiments  Hot sauce. Barbecue sauce. Sweets/Desserts  Chocolate and cocoa. Donuts. Peppermint and spearmint. Fats and Oils  High-fat foods. This includes [place] fries and potato chips. Other  Vinegar. Strong spices. This includes black pepper, white pepper, red pepper, cayenne, curry powder, cloves, ginger, and chili powder. The items listed above may not be a complete list of foods and drinks to avoid. Contact your dietitian for more information.   This information is not intended to replace advice given to you by your health care provider. Make sure you discuss any questions you have with your health care provider. Document Released: 03/05/2012 Document Revised: 02/10/2016 Document Reviewed: 07/09/2013 Elsevier Interactive Patient Education  2017 Elsevier Inc.  Morning Sickness Morning sickness is when you feel sick to your stomach (nauseous) during pregnancy. This nauseous feeling may or may not come with vomiting. It often occurs in the morning but can be a problem any time of day. Morning sickness is most common during the first trimester, but it may continue throughout pregnancy. While morning sickness is unpleasant, it is usually harmless unless you develop severe and continual vomiting (hyperemesis gravidarum). This condition requires more intense treatment. What are the causes? The cause of morning sickness is not completely known but seems to be related to normal hormonal changes that occur in pregnancy. What increases the risk? You are at greater risk if you:  Experienced nausea or vomiting before your pregnancy.  Had morning sickness during a previous pregnancy.  Are pregnant with more than one baby, such as twins. How is this treated? Do not use any medicines (prescription, over-the-counter, or herbal) for morning sickness without first talking to your health care provider. Your health care provider may prescribe or recommend:  Vitamin B6 supplements.  Anti-nausea medicines.  The herbal medicine ginger. Follow these instructions at home:  Only take over-the-counter or prescription medicines as directed by your health care provider.  Taking multivitamins before getting pregnant can prevent or decrease the severity of morning sickness in most women.  Eat a piece of dry toast or unsalted crackers before getting out of bed  in the morning.  Eat five or six small meals a day.  Eat dry and bland foods (rice, baked  potato). Foods high in carbohydrates are often helpful.  Do not drink liquids with your meals. Drink liquids between meals.  Avoid greasy, fatty, and spicy foods.  Get someone to cook for you if the smell of any food causes nausea and vomiting.  If you feel nauseous after taking prenatal vitamins, take the vitamins at night or with a snack.  Snack on protein foods (nuts, yogurt, cheese) between meals if you are hungry.  Eat unsweetened gelatins for desserts.  Wearing an acupressure wristband (worn for sea sickness) may be helpful.  Acupuncture may be helpful.  Do not smoke.  Get a humidifier to keep the air in your house free of odors.  Get plenty of fresh air. Contact a health care provider if:  Your home remedies are not working, and you need medicine.  You feel dizzy or lightheaded.  You are losing weight. Get help right away if:  You have persistent and uncontrolled nausea and vomiting.  You pass out (faint). This information is not intended to replace advice given to you by your health care provider. Make sure you discuss any questions you have with your health care provider. Document Released: 10/26/2006 Document Revised: 02/10/2016 Document Reviewed: 02/19/2013 Elsevier Interactive Patient Education  2017 Elsevier Inc.  Heartburn During Pregnancy Heartburn is pain or discomfort in the throat or chest. It may cause a burning feeling. It happens when stomach acid moves up into the tube that carries food from your mouth to your stomach (esophagus). Heartburn is common during pregnancy. It usually goes away or gets better after giving birth. Follow these instructions at home: Eating and drinking  Do not drink alcohol while you are pregnant.  Figure out which foods and beverages make you feel worse, and avoid them.  Beverages that you may want to avoid include:  Coffee and tea (with or without caffeine).  Energy drinks and sports drinks.  Bubbly (carbonated)  drinks or sodas.  Citrus fruit juices.  Foods that you may want to avoid include:  Chocolate and cocoa.  Peppermint and mint flavorings.  Garlic, onions, and horseradish.  Spicy and acidic foods. These include peppers, chili powder, curry powder, vinegar, hot sauces, and barbecue sauce.  Citrus fruits, such as oranges, lemons, and limes.  Tomato-based foods, such as red sauce, chili, and salsa.  Fried and fatty foods, such as donuts, french fries, potato chips, and high-fat dressings.  High-fat meats, such as hot dogs, cold cuts, sausage, ham, and bacon.  High-fat dairy items, such as whole milk, butter, and cheese.  Eat small meals often, instead of large meals.  Avoid drinking a lot of liquid with your meals.  Avoid eating meals during the 2-3 hours before you go to bed.  Avoid lying down right after you eat.  Do not exercise right after you eat. Medicines  Take over-the-counter and prescription medicines only as told by your doctor.  Do not take aspirin, ibuprofen, or other NSAIDs unless your doctor tells you to do that.  Your doctor may tell you to avoid medicines that have sodium bicarbonate in them. General instructions  If told, raise the head of your bed about 6 inches (15 cm). You can do this by putting blocks under the legs. Sleeping with more pillows does not help with heartburn.  Do not use any products that contain nicotine or tobacco, such as cigarettes and e-cigarettes.  If you need help quitting, ask your doctor.  Wear loose-fitting clothing.  Try to lower your stress, such as with yoga or meditation. If you need help, ask your doctor.  Stay at a healthy weight. If you are overweight, work with your doctor to safely lose weight.  Keep all follow-up visits as told by your doctor. This is important. Contact a doctor if:  You get new symptoms.  Your symptoms do not get better with treatment.  You have weight loss and you do not know why.  You  have trouble swallowing.  You make loud sounds when you breathe (wheeze).  You have a cough that does not go away.  You have heartburn often for more than 2 weeks.  You feel sick to your stomach (nauseous), and this does not get better with treatment.  You are throwing up (vomiting), and this does not get better with treatment.  You have pain in your belly (abdomen). Get help right away if:  You have very bad chest pain that spreads to your arm, neck, or jaw.  You feel sweaty, dizzy, or light-headed.  You have trouble breathing.  You have pain when swallowing.  You throw up and your throw-up looks like blood or coffee grounds.  Your poop (stool) is bloody or black. This information is not intended to replace advice given to you by your health care provider. Make sure you discuss any questions you have with your health care provider. Document Released: 10/07/2010 Document Revised: 05/22/2016 Document Reviewed: 05/22/2016 Elsevier Interactive Patient Education  2017 Elsevier Inc.         [facility]   Francoise CeoBernard Marshall      Phone: [phone]  [facility]     Phone: [phone]  [facility] [place] [facility]  Phone: [phone]  [facility] [place]  Phone: [phone]  [facility]    Phone: [phone]   [facility]    Phone: [phone]  [facility]    Phone: [phone]  [facility]    Phone: [phone]  [facility]    Phone: [phone]  [facility] Phone: [phone]   [facility]  Phone: [phone]  [facility]    Phone: [phone]  [facility]   Phone: [phone]  [facility]      Phone: [phone]  [facility]    Phone: [phone]  [facility]     Phone: [phone]

## 2016-09-17 NOTE — MAU Provider Note (Signed)
History     CSN: [id number]  Arrival date and time: 09/17/16 1625   First Provider Initiated Contact with Patient 09/17/16 1656      Chief Complaint  Patient presents with  . Heartburn  . Emesis During Pregnancy   HPI  Kelsey Cooper is a 25 y.o. [id number] at [id number] who presents with epigastric pain & n/v. [place] & diagnosed with GERD; started on pepcid but reports no improvement in symptoms. Epigastric pain has worsened. Describes as constant burning pain that makes her not want to eat. Rates pain 9/10. Taking pepcid without relief. Nausea & vomiting throughout pregnancy. States was mostly dry heaving today and vomited once. Attempted to ear rice krispy treat this morning but unable to keep it down. Also unable to keep down water or apple juice. Denies lower abdominal pain, vaginal bleeding, vaginal discharge, diarrhea, constipation, fever/chills, or dysuria.   OB History    Gravida Para Term Preterm AB Living   5 2 1 1 2 2    SAB TAB Ectopic Multiple Live Births   1 1     2       Past Medical History:  Diagnosis Date  . Anxiety   . GERD (gastroesophageal reflux disease)   . Severe depression (HCC)     Past Surgical History:  Procedure Laterality Date  . NO PAST SURGERIES      No family history on file.  Social History  Substance Use Topics  . Smoking status: Former Smoker    Packs/day: 1.00    Types: Cigarettes  . Smokeless tobacco: Former NeurosurgeonUser  . Alcohol use No    Allergies: No Known Allergies  Prescriptions Prior to Admission  Medication Sig Dispense Refill Last Dose  . acyclovir (ZOVIRAX) 400 MG tablet Take 2 tablets (800 mg total) by mouth 5 (five) times daily. 70 tablet 0   . predniSONE (DELTASONE) 50 MG tablet Take 1 pill daily for 5 days. 5 tablet 0   . promethazine (PHENERGAN) 25 MG tablet Take 1 tablet (25 mg total) by mouth every 8 (eight) hours as needed for nausea or vomiting. 30 tablet 0     Review of Systems   Constitutional: Negative.   Gastrointestinal: Positive for heartburn, nausea and vomiting. Negative for abdominal pain, constipation and diarrhea.  Genitourinary: Negative.    Physical Exam   Blood pressure 120/72, pulse 81, temperature 98 F (36.7 C), temperature source Oral, resp. rate 16, height 4\' 11"  (1.499 m), weight 127 lb (57.6 kg), last menstrual period 07/19/2016, unknown if currently breastfeeding.  Physical Exam  Nursing note and vitals reviewed. Constitutional: She is oriented to person, place, and time. She appears well-developed and well-nourished. No distress.  HENT:  Head: Normocephalic and atraumatic.  Eyes: Conjunctivae are normal. Right eye exhibits no discharge. Left eye exhibits no discharge. No scleral icterus.  Neck: Normal range of motion.  Cardiovascular: Normal rate, regular rhythm and normal heart sounds.   No murmur heard. Respiratory: Effort normal and breath sounds normal. No respiratory distress. She has no wheezes.  GI: Soft. Bowel sounds are normal. She exhibits no distension. There is no tenderness. There is no rebound.  Neurological: She is alert and oriented to person, place, and time.  Skin: Skin is warm and dry. She is not diaphoretic.  Psychiatric: She has a normal mood and affect. Her behavior is normal. Judgment and thought content normal.    MAU Course  Procedures Results for orders placed or performed  during the hospital encounter of 09/17/16 (from the past 24 hour(s))  Urinalysis, Routine w reflex microscopic     Status: Abnormal   Collection Time: 09/17/16  4:35 PM  Result Value Ref Range   Color, Urine YELLOW YELLOW   APPearance CLOUDY (A) CLEAR   Specific Gravity, Urine 1.028 1.005 - 1.030   pH 7.0 5.0 - 8.0   Glucose, UA NEGATIVE NEGATIVE mg/dL   Hgb urine dipstick NEGATIVE NEGATIVE   Bilirubin Urine NEGATIVE NEGATIVE   Ketones, ur 20 (A) NEGATIVE mg/dL   Protein, ur 30 (A) NEGATIVE mg/dL   Nitrite NEGATIVE NEGATIVE    Leukocytes, UA SMALL (A) NEGATIVE   RBC / HPF 0-5 0 - 5 RBC/hpf   WBC, UA 6-30 0 - 5 WBC/hpf   Bacteria, UA RARE (A) NONE SEEN   Squamous Epithelial / LPF 6-30 (A) NONE SEEN   Mucous PRESENT   Pregnancy, urine POC     Status: Abnormal   Collection Time: 09/17/16  4:55 PM  Result Value Ref Range   Preg Test, Ur POSITIVE (A) NEGATIVE  CBC     Status: None   Collection Time: 09/17/16  5:40 PM  Result Value Ref Range   WBC 9.1 4.0 - 10.5 K/uL   RBC 4.11 3.87 - 5.11 MIL/uL   Hemoglobin 13.1 12.0 - 15.0 g/dL   HCT 09.836.7 11.936.0 - 14.746.0 %   MCV 89.3 78.0 - 100.0 fL   MCH 31.9 26.0 - 34.0 pg   MCHC 35.7 30.0 - 36.0 g/dL   RDW 82.912.8 56.211.5 - 13.015.5 %   Platelets 310 150 - 400 K/uL  Lipase, blood     Status: None   Collection Time: 09/17/16  5:40 PM  Result Value Ref Range   Lipase 14 11 - 51 U/L  Comprehensive metabolic panel     Status: Abnormal   Collection Time: 09/17/16  6:27 PM  Result Value Ref Range   Sodium 131 (L) 135 - 145 mmol/L   Potassium 3.6 3.5 - 5.1 mmol/L   Chloride 101 101 - 111 mmol/L   CO2 24 22 - 32 mmol/L   Glucose, Bld 83 65 - 99 mg/dL   BUN 13 6 - 20 mg/dL   Creatinine, Ser 8.650.59 0.44 - 1.00 mg/dL   Calcium 8.5 (L) 8.9 - 10.3 mg/dL   Total Protein 5.8 (L) 6.5 - 8.1 g/dL   Albumin 3.6 3.5 - 5.0 g/dL   AST 13 (L) 15 - 41 U/L   ALT 15 14 - 54 U/L   Alkaline Phosphatase 47 38 - 126 U/L   Total Bilirubin 0.6 0.3 - 1.2 mg/dL   GFR calc non Af Amer >60 >60 mL/min   GFR calc Af Amer >60 >60 mL/min   Anion gap 6 5 - 15    MDM CBC, CMP, lipase IV LR bolus Phenergan 25 mg & protonix 40 mg IV Pt sleeping in room during reassessment -- not observed vomiting during MAU visit  Assessment and Plan  A; 1. Nausea and vomiting during pregnancy prior to [redacted] weeks gestation   2. Heartburn during pregnancy in first trimester    P: Discharge home D/c pepcid Rx Zantac Continue phenergan as previously prescribed Discussed reasons to return to MAU Keep f/u with OB  Judeth HornErin  Marlee Trentman 09/17/2016, 4:55 PM

## 2016-09-18 NOTE — L&D Delivery Note (Signed)
Delivery Note Patient had a precipitous delivery in the bed approximately 5:25 PM a viable and healthy female was delivered via Vaginal, Spontaneous Delivery  APGAR:9/9 ,weight pending   Placenta delivered at bedside status: intact 3 vessels spontaneous Anesthesia:  Epidural Episiotomy:  n/a Lacerations:  none Suture Repair: n/a Est. Blood Loss (mL):  250 mL  Mom to postpartum.  Baby to Couplet care / Skin to Skin.  Essie HartINN, Kairen Hallinan STACIA 04/27/2017, 5:58 PM

## 2016-09-19 ENCOUNTER — Encounter (HOSPITAL_COMMUNITY): Payer: Self-pay | Admitting: Advanced Practice Midwife

## 2016-09-19 ENCOUNTER — Inpatient Hospital Stay (HOSPITAL_COMMUNITY)
Admission: AD | Admit: 2016-09-19 | Discharge: 2016-09-19 | Disposition: A | Discharge status: Home / Self Care | Payer: Medicaid Other | Source: Ambulatory Visit | Attending: Obstetrics & Gynecology | Admitting: Obstetrics & Gynecology

## 2016-09-19 DIAGNOSIS — R42 Dizziness and giddiness: Secondary | ICD-10-CM | POA: Insufficient documentation

## 2016-09-19 DIAGNOSIS — O26891 Other specified pregnancy related conditions, first trimester: Secondary | ICD-10-CM | POA: Insufficient documentation

## 2016-09-19 DIAGNOSIS — Z87891 Personal history of nicotine dependence: Secondary | ICD-10-CM | POA: Insufficient documentation

## 2016-09-19 DIAGNOSIS — O219 Vomiting of pregnancy, unspecified: Secondary | ICD-10-CM | POA: Diagnosis not present

## 2016-09-19 DIAGNOSIS — Z3A01 Less than 8 weeks gestation of pregnancy: Secondary | ICD-10-CM | POA: Diagnosis not present

## 2016-09-19 DIAGNOSIS — R109 Unspecified abdominal pain: Secondary | ICD-10-CM | POA: Diagnosis present

## 2016-09-19 MED ORDER — PROMETHAZINE HCL 25 MG PO TABS: 25.0000 mg | ORAL_TABLET | Freq: Four times a day (QID) | ORAL | 1 refills | Status: DC | PRN

## 2016-09-19 MED ORDER — FAMOTIDINE IN NACL 20-0.9 MG/50ML-% IV SOLN
20.0000 mg | Freq: Once | INTRAVENOUS | Status: AC
  Administered 2016-09-19: 20 mg via INTRAVENOUS
  Filled 2016-09-19: qty 50

## 2016-09-19 MED ORDER — PROMETHAZINE HCL 25 MG/ML IJ SOLN
25.0000 mg | Freq: Four times a day (QID) | INTRAMUSCULAR | Status: DC | PRN
  Administered 2016-09-19: 25 mg via INTRAVENOUS
  Filled 2016-09-19: qty 1

## 2016-09-19 MED ORDER — LACTATED RINGERS IV SOLN
INTRAVENOUS | Status: DC
  Administered 2016-09-19: 21:00:00 via INTRAVENOUS

## 2016-09-19 MED ORDER — LACTATED RINGERS IV SOLN
INTRAVENOUS | Status: DC
  Administered 2016-09-19: 22:00:00 via INTRAVENOUS

## 2016-09-19 NOTE — MAU Note (Signed)
Pt presents complaining of continued nausea and vomiting since being seen in MAU 2 days ago. Has been taking her medication but it doesn't help or makes her vomit more. Denies vaginal bleeding or discharge.

## 2016-09-19 NOTE — MAU Provider Note (Signed)
Chief Complaint: Morning Sickness; Abdominal Pain; and Dizziness   First Provider Initiated Contact with Patient 09/19/16 2023        SUBJECTIVE HPI: Kelsey Cooper is a 26 y.o. [id number] at 564w6d by LMP who presents to maternity admissions reporting vomiting which is unresponsive to Phenergan at home. Has only used it orally. . She denies vaginal bleeding, vaginal itching/burning, urinary symptoms, h/a, dizziness, or fever/chills.     Emesis   This is a recurrent problem. The current episode started today. The problem occurs 5 to 10 times per day. The problem has been unchanged. There has been no fever. Associated symptoms include dizziness and weight loss. Pertinent negatives include no abdominal pain, chest pain, chills, coughing, diarrhea, fever, headaches or myalgias. Treatments tried: phenergan PO.    RN Note: Pt presents complaining of continued nausea and vomiting since being seen in MAU 2 days ago. Has been taking her medication but it doesn't help or makes her vomit more. Denies vaginal bleeding or discharge.   Past Medical History:  Diagnosis Date  . Anxiety   . GERD (gastroesophageal reflux disease)   . Severe depression (HCC)    Past Surgical History:  Procedure Laterality Date  . NO PAST SURGERIES     Social History   Social History  . Marital status: Single    Spouse name: N/A  . Number of children: N/A  . Years of education: N/A   Occupational History  . Not on file.   Social History Main Topics  . Smoking status: Former Smoker    Packs/day: 1.00    Types: Cigarettes  . Smokeless tobacco: Former NeurosurgeonUser  . Alcohol use No  . Drug use: No  . Sexual activity: Yes    Birth control/ protection: None   Other Topics Concern  . Not on file   Social History Narrative  . No narrative on file   No current facility-administered medications on file prior to encounter.    Current Outpatient Prescriptions on File Prior to Encounter  Medication Sig Dispense Refill   . acyclovir (ZOVIRAX) 400 MG tablet Take 2 tablets (800 mg total) by mouth 5 (five) times daily. 70 tablet 0  . promethazine (PHENERGAN) 25 MG tablet Take 1 tablet (25 mg total) by mouth every 8 (eight) hours as needed for nausea or vomiting. 30 tablet 0  . ranitidine (ZANTAC) 150 MG tablet Take 1 tablet (150 mg total) by mouth 2 (two) times daily. 60 tablet 0   No Known Allergies  I have reviewed patient's Past Medical Hx, Surgical Hx, Family Hx, Social Hx, medications and allergies.   ROS:  Review of Systems  Constitutional: Positive for weight loss. Negative for chills and fever.  Respiratory: Negative for cough.   Cardiovascular: Negative for chest pain.  Gastrointestinal: Positive for vomiting. Negative for abdominal pain and diarrhea.  Musculoskeletal: Negative for myalgias.  Neurological: Positive for dizziness. Negative for headaches.   Review of Systems  Other systems negative   Physical Exam  Physical Exam Patient Vitals for the past 24 hrs:  BP Temp Temp src Pulse Resp Height Weight  09/19/16 2016 124/76 98 F (36.7 C) Oral 88 18 4\' 11"  (1.499 m) 123 lb (55.8 kg)   Constitutional: Well-developed, well-nourished female in no acute distress.  Cardiovascular: normal rate Respiratory: normal effort GI: Abd soft, non-tender. Pos BS x 4 MS: Extremities nontender, no edema, normal ROM Neurologic: Alert and oriented x 4.  GU: Neg CVAT.  PELVIC EXAM: deferred  LAB RESULTS  No results found for this or any previous visit (from the past 24 hour(s)).     IMAGING No results found.  MAU Management/MDM: Will start IV and give bolus of fluids Will give dose of Phenergan States people at work gave her a pill to dissolve under her tongue that starts with an O.  It helped a lot Will also give a dose of Pepcid due to c/o acid reflux  ASSESSMENT  PLAN Report given to oncoming provider. WIll hydrate and discharge home with Rx for either vaginal Phenergan or possibly  zofran, per discretion of provider  Wynelle Bourgeois CNM, MSN Certified Nurse-Midwife   Pt has not been taking phenergan at home. Discussed use of meds to treat symptoms. Pt agreeable to trying phenergan PV or PR.  Start prenatal care Discussed reasons to return to MAU Advance diet as tolerate  Judeth Horn, NP   09/19/2016  8:23 PM

## 2016-09-19 NOTE — Discharge Instructions (Signed)
Eating Plan for Hyperemesis Gravidarum °Hyperemesis gravidarum is a severe form of morning sickness. Because this condition causes severe nausea and vomiting, it can lead to dehydration, malnutrition, and weight loss. One way to lessen the symptoms of nausea and vomiting is to follow the eating plan for hyperemesis gravidarum. It is often used along with prescribed medicines to control your symptoms. °What can I do to relieve my symptoms? °Listen to your body. Everyone is different and has different preferences. Find what works best for you. Take any of the following actions that are helpful to you: °· Eat and drink slowly. °· Eat 5-6 small meals daily instead of 3 large meals. °· Eat crackers before you get out of bed in the morning. °· Try having a snack in the middle of the night. °· Starchy foods are usually tolerated well. Examples include cereal, toast, bread, potatoes, pasta, rice, and pretzels. °· Ginger may help with nausea. Add ¼ tsp ground ginger to hot tea or choose ginger tea. °· Try drinking 100% fruit juice or an electrolyte drink. An electrolyte drink contains sodium, potassium, and chloride. °· Continue to take your prenatal vitamins as told by your health care provider. If you are having trouble taking your prenatal vitamins, talk with your health care provider about different options. °· Include at least 1 serving of protein with your meals and snacks. Protein options include meats or poultry, beans, nuts, eggs, and yogurt. Try eating a protein-rich snack before bed. Examples of these snacks include cheese and crackers or half of a peanut butter or turkey sandwich. °· Consider eliminating foods that trigger your symptoms. These may include spicy foods, coffee, high-fat foods, very sweet foods, and acidic foods. °· Try meals that have more protein combined with bland, salty, lower-fat, and dry foods, such as nuts, seeds, pretzels, crackers, and cereal. °· Talk with your healthcare provider about  starting a supplement of vitamin B6. °· Have fluids that are cold, clear, and carbonated or sour. Examples include lemonade, ginger ale, lemon-lime soda, ice water, and sparkling water. °· Try lemon or mint tea. °· Try brushing your teeth or using a mouth rinse after meals. °What should I avoid to reduce my symptoms? °Avoiding some of the following things may help reduce your symptoms. °· Foods with strong smells. Try eating meals in well-ventilated areas that are free of odors. °· Drinking water or other beverages with meals. Try not to drink anything during the 30 minutes before and after your meals. °· Drinking more than 1 cup of fluid at a time. Sometimes using a straw helps. °· Fried or high-fat foods, such as butter and cream sauces. °· Spicy foods. °· Skipping meals as best as you can. Nausea can be more intense on an empty stomach. If you cannot tolerate food at that time, do not force it. Try sucking on ice chips or other frozen items, and make up for missed calories later. °· Lying down within 2 hours after eating. °· Environmental triggers. These may include smoky rooms, closed spaces, rooms with strong smells, warm or humid places, overly loud and noisy rooms, and rooms with motion or flickering lights. °· Quick and sudden changes in your movement. °This information is not intended to replace advice given to you by your health care provider. Make sure you discuss any questions you have with your health care provider. °Document Released: 07/02/2007 Document Revised: 05/03/2016 Document Reviewed: 04/04/2016 °Elsevier Interactive Patient Education © 2017 [employer] ° °

## 2016-09-21 ENCOUNTER — Encounter (HOSPITAL_COMMUNITY): Payer: Self-pay | Admitting: Nurse Practitioner

## 2016-09-21 ENCOUNTER — Emergency Department (HOSPITAL_COMMUNITY): Payer: Medicaid Other

## 2016-09-21 ENCOUNTER — Emergency Department (HOSPITAL_COMMUNITY)
Admission: EM | Admit: 2016-09-21 | Discharge: 2016-09-21 | Disposition: A | Discharge status: Home / Self Care | Payer: Medicaid Other | Attending: Physician Assistant | Admitting: Physician Assistant

## 2016-09-21 DIAGNOSIS — O211 Hyperemesis gravidarum with metabolic disturbance: Secondary | ICD-10-CM

## 2016-09-21 DIAGNOSIS — O0281 Inappropriate change in quantitative human chorionic gonadotropin (hCG) in early pregnancy: Secondary | ICD-10-CM | POA: Diagnosis not present

## 2016-09-21 DIAGNOSIS — O4692 Antepartum hemorrhage, unspecified, second trimester: Secondary | ICD-10-CM | POA: Diagnosis present

## 2016-09-21 DIAGNOSIS — Z3A22 22 weeks gestation of pregnancy: Secondary | ICD-10-CM | POA: Diagnosis not present

## 2016-09-21 DIAGNOSIS — Z87891 Personal history of nicotine dependence: Secondary | ICD-10-CM | POA: Insufficient documentation

## 2016-09-21 DIAGNOSIS — Z79899 Other long term (current) drug therapy: Secondary | ICD-10-CM | POA: Diagnosis not present

## 2016-09-21 LAB — CBC WITH DIFFERENTIAL/PLATELET
Basophils Absolute: 0 10*3/uL (ref 0.0–0.1)
Basophils Relative: 0 %
EOS ABS: 0 10*3/uL (ref 0.0–0.7)
EOS PCT: 1 %
HCT: 33.9 % — ABNORMAL LOW (ref 36.0–46.0)
Hemoglobin: 11.9 g/dL — ABNORMAL LOW (ref 12.0–15.0)
LYMPHS ABS: 2.8 10*3/uL (ref 0.7–4.0)
LYMPHS PCT: 37 %
MCH: 31.4 pg (ref 26.0–34.0)
MCHC: 35.1 g/dL (ref 30.0–36.0)
MCV: 89.4 fL (ref 78.0–100.0)
MONOS PCT: 3 %
Monocytes Absolute: 0.2 10*3/uL (ref 0.1–1.0)
Neutro Abs: 4.6 10*3/uL (ref 1.7–7.7)
Neutrophils Relative %: 59 %
PLATELETS: 270 10*3/uL (ref 150–400)
RBC: 3.79 MIL/uL — AB (ref 3.87–5.11)
RDW: 12.3 % (ref 11.5–15.5)
WBC: 7.7 10*3/uL (ref 4.0–10.5)

## 2016-09-21 LAB — BASIC METABOLIC PANEL
Anion gap: 8 (ref 5–15)
BUN: 11 mg/dL (ref 6–20)
CHLORIDE: 104 mmol/L (ref 101–111)
CO2: 20 mmol/L — ABNORMAL LOW (ref 22–32)
CREATININE: 0.67 mg/dL (ref 0.44–1.00)
Calcium: 8.9 mg/dL (ref 8.9–10.3)
GFR calc Af Amer: >60 mL/min (ref >60)
GFR calc non Af Amer: >60 mL/min (ref >60)
GLUCOSE: 112 mg/dL — AB (ref 65–99)
POTASSIUM: 3.6 mmol/L (ref 3.5–5.1)
SODIUM: 132 mmol/L — AB (ref 135–145)

## 2016-09-21 LAB — URINALYSIS, ROUTINE W REFLEX MICROSCOPIC
BILIRUBIN URINE: NEGATIVE
GLUCOSE, UA: NEGATIVE mg/dL
HGB URINE DIPSTICK: NEGATIVE
KETONES UR: 80 mg/dL — AB
LEUKOCYTES UA: NEGATIVE
NITRITE: NEGATIVE
PH: 6 (ref 5.0–8.0)
Protein, ur: 30 mg/dL — AB
Specific Gravity, Urine: 1.031 — ABNORMAL HIGH (ref 1.005–1.030)

## 2016-09-21 LAB — HCG, QUANTITATIVE, PREGNANCY: hCG, Beta Chain, Quant, S: 101591 m[IU]/mL — ABNORMAL HIGH (ref <5)

## 2016-09-21 MED ORDER — METOCLOPRAMIDE HCL 5 MG/ML IJ SOLN
10.0000 mg | Freq: Once | INTRAMUSCULAR | Status: AC
  Administered 2016-09-21: 10 mg via INTRAVENOUS
  Filled 2016-09-21: qty 2

## 2016-09-21 MED ORDER — METOCLOPRAMIDE HCL 10 MG PO TABS: 10.0000 mg | ORAL_TABLET | Freq: Three times a day (TID) | ORAL | 0 refills | Status: DC | PRN

## 2016-09-21 MED ORDER — ENSURE HEALTHY MOM PO LIQD: 1.0000 | Freq: Two times a day (BID) | ORAL | 0 refills | Status: DC

## 2016-09-21 MED ORDER — SODIUM CHLORIDE 0.9 % IV SOLN
INTRAVENOUS | Status: AC
  Administered 2016-09-21: 22:00:00 via INTRAVENOUS

## 2016-09-21 MED ORDER — FAMOTIDINE 20 MG PO TABS: 20.0000 mg | ORAL_TABLET | Freq: Two times a day (BID) | ORAL | 1 refills | Status: DC

## 2016-09-21 NOTE — ED Notes (Addendum)
Pt states she has been vomiting and experiencing abdominal pain x 1 week. "can't keep anything down"  States she has tried multiple foods and liquids but is unable to "even hold down water".   Today had 1 episode of a small amount of blood on the tissue when she went to the bathroom.

## 2016-09-21 NOTE — ED Provider Notes (Signed)
[facility]-EMERGENCY DEPT Provider Note   CSN: [id number] Arrival date & time: 09/21/16  1850  By signing my name below, I, Rosario Adie, attest that this documentation has been prepared under the direction and in the presence of [facility], [place].  Electronically Signed: Rosario Adie, ED Scribe. 09/21/16. 8:31 PM.  History   Chief Complaint Chief Complaint  Patient presents with  . Vaginal Bleeding  . Emesis   The history is provided by the patient and medical records. No language interpreter was used.     HPI Comments: Kelsey Cooper is a  26 y.o. female [id number] [redacted]w[redacted]d by LMP who presents to the Emergency Department complaining of persistent nausea and vomiting beginning one week ago. She notes associated epigastric abdominal pain secondary to this issue as well. Pt was seen for her symptoms in [place] previously, and at that time she was dx'd w/ GERD and started on Pepcid which she took without relief. Pt was again seen twice at [facility] for this, and was prescribed Phenergan to take vaginally. She has been taking Phenergan at home without relief of her symptoms, and states that she recently stopped this d/t causing vaginal discomfort. She additionally notes that today she had some light spotting secondary to her other symptoms as well. Her LMP was ~2.5 months ago; however, she is unsure overall if this was normal or abnormal and is unable to recall her LNMP. Pt has not previously had [place] confirmation of this pregnancy. Pt is currently sexual active, and has been with her current partner for two years. She has a prior h/o Trichomonas. She denies dizziness, or any other associated symptoms.   Past Medical History:  Diagnosis Date  . Anxiety   . GERD (gastroesophageal reflux disease)   . Severe depression (HCC)    There are no active problems to display for this patient.  Past Surgical History:  Procedure Laterality Date  . NO PAST SURGERIES     OB History    Gravida Para Term  Preterm AB Living   5 2 1 1 2 2    SAB TAB Ectopic Multiple Live Births   1 1     2      Home Medications    Prior to Admission medications   Medication Sig Start Date End Date Taking? Authorizing Provider  famotidine (PEPCID) 20 MG tablet Take 1 tablet (20 mg total) by mouth 2 (two) times daily. 09/21/16   Hope Orlene Och, NP  metoCLOPramide (REGLAN) 10 MG tablet Take 1 tablet (10 mg total) by mouth every 8 (eight) hours as needed for nausea. 09/21/16   Hope Orlene Och, NP  Nutritional Supplements (ENSURE HEALTHY MOM) LIQD Take 1 Bottle by mouth 2 (two) times daily. 09/21/16   Hope Orlene Och, NP  promethazine (PHENERGAN) 25 MG tablet Take 1 tablet (25 mg total) by mouth every 6 (six) hours as needed for nausea or vomiting. 09/19/16   Judeth Horn, NP   Family History History reviewed. No pertinent family history.  Social History Social History  Substance Use Topics  . Smoking status: Former Smoker    Packs/day: 1.00    Types: Cigarettes  . Smokeless tobacco: Former Neurosurgeon  . Alcohol use No   Allergies   Patient has no known allergies.  Review of Systems Review of Systems  Gastrointestinal: Positive for abdominal pain, diarrhea and vomiting.  Genitourinary: Positive for vaginal bleeding.  Neurological: Negative for dizziness.  All other systems reviewed and are negative.  Physical Exam Updated  Vital Signs BP 117/60 (BP Location: Left Arm)   Pulse 76   Temp 100 F (37.8 C) (Oral)   Resp 22   LMP 07/19/2016 (Within Weeks)   SpO2 100%   Physical Exam  Constitutional: She appears well-developed and well-nourished. No distress.  HENT:  Head: Normocephalic and atraumatic.  Eyes: Conjunctivae are normal.  Neck: Normal range of motion.  Cardiovascular: Normal rate.   Pulmonary/Chest: Effort normal.  Abdominal: She exhibits no distension.  Genitourinary:  Genitourinary Comments: Chaperone present throughout entire exam. External gnentalia without lesions. White d/c in vaginal vault.  Cervix is long and closed. No CMT. No adnexal mass or tenderness. Uterus 7 weeks in size.   Musculoskeletal: Normal range of motion.  Neurological: She is alert.  Skin: No pallor.  Psychiatric: She has a normal mood and affect. Her behavior is normal.  Nursing note and vitals reviewed.  ED Treatments / Results  DIAGNOSTIC STUDIES: Oxygen Saturation is 100% on RA, normal by my interpretation.   COORDINATION OF CARE: 8:31 PM-Discussed next steps with pt. Pt verbalized understanding and is agreeable with the plan.   Labs (all labs ordered are listed, but only abnormal results are displayed) Labs Reviewed  HCG, QUANTITATIVE, PREGNANCY - Abnormal; Notable for the following:       Result Value   hCG, Beta Chain, Quant, S 101,591 (*)    All other components within normal limits  CBC WITH DIFFERENTIAL/PLATELET - Abnormal; Notable for the following:    RBC 3.79 (*)    Hemoglobin 11.9 (*)    HCT 33.9 (*)    All other components within normal limits  BASIC METABOLIC PANEL - Abnormal; Notable for the following:    Sodium 132 (*)    CO2 20 (*)    Glucose, Bld 112 (*)    All other components within normal limits  URINALYSIS, ROUTINE W REFLEX MICROSCOPIC - Abnormal; Notable for the following:    APPearance HAZY (*)    Specific Gravity, Urine 1.031 (*)    Ketones, ur 80 (*)    Protein, ur 30 (*)    Bacteria, UA RARE (*)    Squamous Epithelial / LPF 6-30 (*)    All other components within normal limits  RPR  HIV ANTIBODY (ROUTINE TESTING)   Radiology [place] Ob Comp Less 14 Wks  Result Date: 09/21/2016 CLINICAL DATA:  Hyper emesis with abdominal pain EXAM: OBSTETRIC <14 WK US AND TRANSVAGINAL OB US TECHNIQUE: Both transabdominal and transvaginal ultrasound examinations were performed for complete evaluation of the gestation as well as the maternal uterus, adnexal regions, and pelvic cul-de-sac. Transvaginal technique was performed to assess early pregnancy. COMPARISON:  None. FINDINGS:  Intrauterine gestational sac: Single intrauterine gestation visualized. Yolk sac:  Visualized Embryo:  Visualized Cardiac Activity: Visualized Heart Rate: 139  bpm CRL:  9.8  mm   7 w   0 d                  US EDC: 05/10/2017 Subchorionic hemorrhage:  None visualized. Maternal uterus/adnexae: The right ovary measures 2.8 x 2.1 x 2.3 cm. There is a 1.2 x 0.7 x 1.2 cm cyst. Left ovary measures 3.1 x 2.4 x 2.6 cm. Possible hemorrhagic corpus luteal cyst in the left ovary measuring 1.7 x 1.7 x 1.7 cm. No free fluid. IMPRESSION: Single viable intrauterine gestation as described above. Possible hemorrhagic corpus luteal cyst in the left ovary measuring 1.7 cm. Electronically Signed   By: Jasmine PangKim  Fujinaga M.D.   On:  09/21/2016 22:13   US Ob Transvaginal  Result Date: 09/21/2016 CLINICAL DATA:  Hyper emesis with abdominal pain EXAM: OBSTETRIC <14 WK [place] AND TRANSVAGINAL OB US TECHNIQUE: Both transabdominal and transvaginal ultrasound examinations were performed for complete evaluation of the gestation as well as the maternal uterus, adnexal regions, and pelvic cul-de-sac. Transvaginal technique was performed to assess early pregnancy. COMPARISON:  None. FINDINGS: Intrauterine gestational sac: Single intrauterine gestation visualized. Yolk sac:  Visualized Embryo:  Visualized Cardiac Activity: Visualized Heart Rate: 139  bpm CRL:  9.8  mm   7 w   0 d                  [place] EDC: 05/10/2017 Subchorionic hemorrhage:  None visualized. Maternal uterus/adnexae: The right ovary measures 2.8 x 2.1 x 2.3 cm. There is a 1.2 x 0.7 x 1.2 cm cyst. Left ovary measures 3.1 x 2.4 x 2.6 cm. Possible hemorrhagic corpus luteal cyst in the left ovary measuring 1.7 x 1.7 x 1.7 cm. No free fluid. IMPRESSION: Single viable intrauterine gestation as described above. Possible hemorrhagic corpus luteal cyst in the left ovary measuring 1.7 cm. Electronically Signed   By: Jasmine Pang M.D.   On: 09/21/2016 22:13    Procedures Procedures    Medications Ordered in ED Medications  0.9 %  sodium chloride infusion ( Intravenous Stopped 09/21/16 2352)  metoCLOPramide (REGLAN) injection 10 mg (10 mg Intravenous Given 09/21/16 2136)    Initial Impression / Assessment and Plan / ED Course  I have reviewed the triage vital signs and the nursing notes.  Pertinent labs & imaging results that were available during my care of the patient were reviewed by me and considered in my medical decision making (see chart for details).  Clinical Course   25 y.o. female with n/v in early pregnancy stable for d/c with significant improvement after IV hydration, Reglan and PO hydration. Rx for Reglan given and patient will start prenatal care. [facility] return.   Final Clinical Impressions(s) / ED Diagnoses   Final diagnoses:  Hyperemesis gravidarum before end of [redacted] week gestation with carbohydrate depletion   New Prescriptions Discharge Medication List as of 09/21/2016 11:41 PM    START taking these medications   Details  famotidine (PEPCID) 20 MG tablet Take 1 tablet (20 mg total) by mouth 2 (two) times daily., Starting Thu 09/21/2016, Print    metoCLOPramide (REGLAN) 10 MG tablet Take 1 tablet (10 mg total) by mouth every 8 (eight) hours as needed for nausea., Starting Thu 09/21/2016, Print    Nutritional Supplements (ENSURE HEALTHY MOM) LIQD Take 1 Bottle by mouth 2 (two) times daily., Starting Thu 09/21/2016, Print       *I personally performed the services described in this documentation, which was scribed in my presence. The recorded information has been reviewed and is accurate.     [address] [place], NP 09/22/16 4098    Abelino Derrick, MD 09/26/16 1191

## 2016-09-21 NOTE — ED Notes (Signed)
Pt given coke to drink 

## 2016-09-21 NOTE — ED Triage Notes (Addendum)
Pt presents with c/o vomiting and vaginal bleeding. The vomiting began several days ago. The vomiting has been persistent since onset. [facility] and [facility] but does not feel any better. She states she is pregnant but "no [facility] would tell me how far along." today she began to have some light vaginal spotting. she thinks her last period was around October

## 2016-09-22 LAB — HIV ANTIBODY (ROUTINE TESTING W REFLEX): HIV SCREEN 4TH GENERATION: NONREACTIVE

## 2016-09-22 LAB — RPR: RPR: NONREACTIVE

## 2016-09-25 LAB — GC/CHLAMYDIA PROBE AMP (~~LOC~~) NOT AT ARMC
CHLAMYDIA, DNA PROBE: NEGATIVE
NEISSERIA GONORRHEA: NEGATIVE

## 2016-10-11 ENCOUNTER — Encounter (HOSPITAL_COMMUNITY): Payer: Self-pay | Admitting: *Deleted

## 2016-10-11 ENCOUNTER — Inpatient Hospital Stay (HOSPITAL_COMMUNITY)
Admission: AD | Admit: 2016-10-11 | Discharge: 2016-10-11 | Disposition: A | Discharge status: Home / Self Care | Payer: Medicaid Other | Source: Ambulatory Visit | Attending: Obstetrics & Gynecology | Admitting: Obstetrics & Gynecology

## 2016-10-11 DIAGNOSIS — O99611 Diseases of the digestive system complicating pregnancy, first trimester: Secondary | ICD-10-CM | POA: Diagnosis not present

## 2016-10-11 DIAGNOSIS — K219 Gastro-esophageal reflux disease without esophagitis: Secondary | ICD-10-CM | POA: Insufficient documentation

## 2016-10-11 DIAGNOSIS — Z87891 Personal history of nicotine dependence: Secondary | ICD-10-CM | POA: Diagnosis not present

## 2016-10-11 DIAGNOSIS — Z3A10 10 weeks gestation of pregnancy: Secondary | ICD-10-CM

## 2016-10-11 DIAGNOSIS — O219 Vomiting of pregnancy, unspecified: Secondary | ICD-10-CM

## 2016-10-11 DIAGNOSIS — O210 Mild hyperemesis gravidarum: Secondary | ICD-10-CM | POA: Insufficient documentation

## 2016-10-11 LAB — CBC WITH DIFFERENTIAL/PLATELET
BASOS ABS: 0 10*3/uL (ref 0.0–0.1)
BASOS PCT: 0 %
EOS ABS: 0.1 10*3/uL (ref 0.0–0.7)
EOS PCT: 1 %
HEMATOCRIT: 31.9 % — AB (ref 36.0–46.0)
Hemoglobin: 11.5 g/dL — ABNORMAL LOW (ref 12.0–15.0)
Lymphocytes Relative: 30 %
Lymphs Abs: 2 10*3/uL (ref 0.7–4.0)
MCH: 32.1 pg (ref 26.0–34.0)
MCHC: 36.1 g/dL — AB (ref 30.0–36.0)
MCV: 89.1 fL (ref 78.0–100.0)
MONO ABS: 0.3 10*3/uL (ref 0.1–1.0)
MONOS PCT: 4 %
NEUTROS ABS: 4.3 10*3/uL (ref 1.7–7.7)
Neutrophils Relative %: 65 %
PLATELETS: 269 10*3/uL (ref 150–400)
RBC: 3.58 MIL/uL — ABNORMAL LOW (ref 3.87–5.11)
RDW: 12.8 % (ref 11.5–15.5)
WBC: 6.6 10*3/uL (ref 4.0–10.5)

## 2016-10-11 LAB — COMPREHENSIVE METABOLIC PANEL
ALBUMIN: 3.7 g/dL (ref 3.5–5.0)
ALT: 16 U/L (ref 14–54)
ANION GAP: 7 (ref 5–15)
AST: 14 U/L — AB (ref 15–41)
Alkaline Phosphatase: 46 U/L (ref 38–126)
BILIRUBIN TOTAL: 0.3 mg/dL (ref 0.3–1.2)
BUN: 9 mg/dL (ref 6–20)
CHLORIDE: 102 mmol/L (ref 101–111)
CO2: 25 mmol/L (ref 22–32)
Calcium: 8.7 mg/dL — ABNORMAL LOW (ref 8.9–10.3)
Creatinine, Ser: 0.61 mg/dL (ref 0.44–1.00)
GFR calc Af Amer: >60 mL/min (ref >60)
GFR calc non Af Amer: >60 mL/min (ref >60)
GLUCOSE: 84 mg/dL (ref 65–99)
POTASSIUM: 3.6 mmol/L (ref 3.5–5.1)
SODIUM: 134 mmol/L — AB (ref 135–145)
Total Protein: 6.3 g/dL — ABNORMAL LOW (ref 6.5–8.1)

## 2016-10-11 LAB — URINALYSIS, ROUTINE W REFLEX MICROSCOPIC
BILIRUBIN URINE: NEGATIVE
GLUCOSE, UA: NEGATIVE mg/dL
HGB URINE DIPSTICK: NEGATIVE
KETONES UR: NEGATIVE mg/dL
Nitrite: NEGATIVE
PH: 6 (ref 5.0–8.0)
PROTEIN: NEGATIVE mg/dL
Specific Gravity, Urine: 1.021 (ref 1.005–1.030)

## 2016-10-11 MED ORDER — METOCLOPRAMIDE HCL 10 MG PO TABS
10.0000 mg | ORAL_TABLET | Freq: Once | ORAL | Status: AC
  Administered 2016-10-11: 10 mg via ORAL
  Filled 2016-10-11: qty 1

## 2016-10-11 MED ORDER — OMEPRAZOLE 20 MG PO CPDR: 20.0000 mg | DELAYED_RELEASE_CAPSULE | Freq: Two times a day (BID) | ORAL | 2 refills | Status: DC

## 2016-10-11 MED ORDER — METOCLOPRAMIDE HCL 10 MG PO TABS: 10.0000 mg | ORAL_TABLET | Freq: Three times a day (TID) | ORAL | 2 refills | Status: DC | PRN

## 2016-10-11 NOTE — MAU Note (Signed)
Can't keep no foods down.  Phenergan not working, just making her sleepy.  Has been having some pain in RLQ earlier.

## 2016-10-11 NOTE — MAU Provider Note (Signed)
History     CSN: [id number]  Arrival date and time: 10/11/16 1443   First Provider Initiated Contact with Patient 10/11/16 1606      Chief Complaint  Patient presents with  . Emesis   HPI Ms. Kerry Doryaryn Catanzaro is a 26 y.o. [id number] at 9127w0d who presents to MAU today with complaint of nausea and vomiting throughout the pregnancy although worse today. She has had persistent nausea while taking Phenergan and feels that it makes her too sleepy. She had Reglan in the past that helped, but ran out. She states minimal PO intake over the last few days, but later states there are multiple things that she can eat without emesis. She also complains of constipation, but had BM yesterday. She denies abdominal pain or vaginal bleeding. She has a history of GERD, but is not taking any medications. She is often making poor food choices for a patient with GERD and N/V.  OB History    Gravida Para Term Preterm AB Living   5 2 1 1 2 2    SAB TAB Ectopic Multiple Live Births   1 1     2       Past Medical History:  Diagnosis Date  . Anxiety   . GERD (gastroesophageal reflux disease)   . Severe depression (HCC)     Past Surgical History:  Procedure Laterality Date  . NO PAST SURGERIES      History reviewed. No pertinent family history.  Social History  Substance Use Topics  . Smoking status: Former Smoker    Packs/day: 1.00    Types: Cigarettes  . Smokeless tobacco: Former NeurosurgeonUser  . Alcohol use No    Allergies: No Known Allergies  No prescriptions prior to admission.    Review of Systems  Constitutional: Negative for fever.  Gastrointestinal: Positive for constipation, nausea and vomiting. Negative for abdominal pain and diarrhea.  Genitourinary: Negative for dysuria, frequency, urgency, vaginal bleeding and vaginal discharge.   Physical Exam   Blood pressure 113/64, pulse 78, temperature 98 F (36.7 C), temperature source Oral, resp. rate 18, weight 126 lb (57.2 kg), last menstrual  period 07/19/2016, unknown if currently breastfeeding.  Physical Exam  Nursing note and vitals reviewed. Constitutional: She is oriented to person, place, and time. She appears well-developed and well-nourished. No distress.  HENT:  Head: Normocephalic and atraumatic.  Cardiovascular: Normal rate.   Respiratory: Effort normal.  GI: Soft. She exhibits no distension and no mass. There is no tenderness. There is no rebound and no guarding.  Neurological: She is alert and oriented to person, place, and time.  Skin: Skin is warm and dry. No erythema.  Psychiatric: She has a normal mood and affect.    Results for orders placed or performed during the hospital encounter of 10/11/16 (from the past 24 hour(s))  Urinalysis, Routine w reflex microscopic     Status: Abnormal   Collection Time: 10/11/16  3:55 PM  Result Value Ref Range   Color, Urine YELLOW YELLOW   APPearance CLOUDY (A) CLEAR   Specific Gravity, Urine 1.021 1.005 - 1.030   pH 6.0 5.0 - 8.0   Glucose, UA NEGATIVE NEGATIVE mg/dL   Hgb urine dipstick NEGATIVE NEGATIVE   Bilirubin Urine NEGATIVE NEGATIVE   Ketones, ur NEGATIVE NEGATIVE mg/dL   Protein, ur NEGATIVE NEGATIVE mg/dL   Nitrite NEGATIVE NEGATIVE   Leukocytes, UA LARGE (A) NEGATIVE   RBC / HPF 0-5 0 - 5 RBC/hpf   WBC, UA 6-30 0 - 5  WBC/hpf   Bacteria, UA RARE (A) NONE SEEN   Squamous Epithelial / LPF 6-30 (A) NONE SEEN   Mucous PRESENT   CBC with Differential/Platelet     Status: Abnormal   Collection Time: 10/11/16  4:46 PM  Result Value Ref Range   WBC 6.6 4.0 - 10.5 K/uL   RBC 3.58 (L) 3.87 - 5.11 MIL/uL   Hemoglobin 11.5 (L) 12.0 - 15.0 g/dL   HCT 16.1 (L) 09.6 - 04.5 %   MCV 89.1 78.0 - 100.0 fL   MCH 32.1 26.0 - 34.0 pg   MCHC 36.1 (H) 30.0 - 36.0 g/dL   RDW 40.9 81.1 - 91.4 %   Platelets 269 150 - 400 K/uL   Neutrophils Relative % 65 %   Neutro Abs 4.3 1.7 - 7.7 K/uL   Lymphocytes Relative 30 %   Lymphs Abs 2.0 0.7 - 4.0 K/uL   Monocytes Relative 4  %   Monocytes Absolute 0.3 0.1 - 1.0 K/uL   Eosinophils Relative 1 %   Eosinophils Absolute 0.1 0.0 - 0.7 K/uL   Basophils Relative 0 %   Basophils Absolute 0.0 0.0 - 0.1 K/uL  Comprehensive metabolic panel     Status: Abnormal   Collection Time: 10/11/16  4:46 PM  Result Value Ref Range   Sodium 134 (L) 135 - 145 mmol/L   Potassium 3.6 3.5 - 5.1 mmol/L   Chloride 102 101 - 111 mmol/L   CO2 25 22 - 32 mmol/L   Glucose, Bld 84 65 - 99 mg/dL   BUN 9 6 - 20 mg/dL   Creatinine, Ser 7.82 0.44 - 1.00 mg/dL   Calcium 8.7 (L) 8.9 - 10.3 mg/dL   Total Protein 6.3 (L) 6.5 - 8.1 g/dL   Albumin 3.7 3.5 - 5.0 g/dL   AST 14 (L) 15 - 41 U/L   ALT 16 14 - 54 U/L   Alkaline Phosphatase 46 38 - 126 U/L   Total Bilirubin 0.3 0.3 - 1.2 mg/dL   GFR calc non Af Amer >60 >60 mL/min   GFR calc Af Amer >60 >60 mL/min   Anion gap 7 5 - 15    MAU Course  Procedures None  MDM UA today without evidence of dehydration Reglan PO given in MAU. No emesis while in MAU.  Given patient reports lack of PO intake, CBC, CMP obtained today and without gross abnormalities Patient states that she has "lost so much weight" and then reports a 3 lb weight loss today. Per our records she has gained 3 lbs frm her last visit in MAU.  Had lengthy discussion with the patient about diet and medication management of her symptoms.  Assessment and Plan  A: SIUP at [redacted]w[redacted]d Nausea and vomiting in pregnancy prior to [redacted] weeks gestation  GERD  P: Discharge home Rx for Prilosec and Reglan given to patient  Warning signs for worsening condition discussed AVS contains information about diet for N/V in pregnancy Patient advised to follow-up with OB provider of choice, list of area OB providers given Patient may return to MAU as needed or if her condition were to change or worsen   Marny Lowenstein, PA-C  10/11/2016, 5:55 PM

## 2016-10-11 NOTE — Discharge Instructions (Signed)
Food Choices for Gastroesophageal Reflux Disease, Adult When you have gastroesophageal reflux disease (GERD), the foods you eat and your eating habits are very important. Choosing the right foods can help ease the discomfort of GERD. What general guidelines do I need to follow?  Choose fruits, vegetables, whole grains, low-fat dairy products, and low-fat meat, fish, and poultry.  Limit fats such as oils, salad dressings, butter, nuts, and avocado.  Keep a food diary to identify foods that cause symptoms.  Avoid foods that cause reflux. These may be different for different people.  Eat frequent small meals instead of three large meals each day.  Eat your meals slowly, in a relaxed setting.  Limit fried foods.  Cook foods using methods other than frying.  Avoid drinking alcohol.  Avoid drinking large amounts of liquids with your meals.  Avoid bending over or lying down until 2-3 hours after eating. What foods are not recommended? The following are some foods and drinks that may worsen your symptoms: Vegetables  Tomatoes. Tomato juice. Tomato and spaghetti sauce. Chili peppers. Onion and garlic. Horseradish. Fruits  Oranges, grapefruit, and lemon (fruit and juice). Meats  High-fat meats, fish, and poultry. This includes hot dogs, ribs, ham, sausage, salami, and bacon. Dairy  Whole milk and chocolate milk. Sour cream. Cream. Butter. Ice cream. Cream cheese. Beverages  Coffee and tea, with or without caffeine. Carbonated beverages or energy drinks. Condiments  Hot sauce. Barbecue sauce. Sweets/Desserts  Chocolate and cocoa. Donuts. Peppermint and spearmint. Fats and Oils  High-fat foods, including [place] fries and potato chips. Other  Vinegar. Strong spices, such as black pepper, white pepper, red pepper, cayenne, curry powder, cloves, ginger, and chili powder. The items listed above may not be a complete list of foods and beverages to avoid. Contact your dietitian for more  information.  This information is not intended to replace advice given to you by your health care provider. Make sure you discuss any questions you have with your health care provider. Document Released: 09/04/2005 Document Revised: 02/10/2016 Document Reviewed: 07/09/2013 Elsevier Interactive Patient Education  2017 Elsevier Inc. Eating Plan for Hyperemesis Gravidarum Hyperemesis gravidarum is a severe form of morning sickness. Because this condition causes severe nausea and vomiting, it can lead to dehydration, malnutrition, and weight loss. One way to lessen the symptoms of nausea and vomiting is to follow the eating plan for hyperemesis gravidarum. It is often used along with prescribed medicines to control your symptoms. What can I do to relieve my symptoms? Listen to your body. Everyone is different and has different preferences. Find what works best for you. Take any of the following actions that are helpful to you:  Eat and drink slowly.  Eat 5-6 small meals daily instead of 3 large meals.  Eat crackers before you get out of bed in the morning.  Try having a snack in the middle of the night.  Starchy foods are usually tolerated well. Examples include cereal, toast, bread, potatoes, pasta, rice, and pretzels.  Ginger may help with nausea. Add  tsp ground ginger to hot tea or choose ginger tea.  Try drinking 100% fruit juice or an electrolyte drink. An electrolyte drink contains sodium, potassium, and chloride.  Continue to take your prenatal vitamins as told by your health care provider. If you are having trouble taking your prenatal vitamins, talk with your health care provider about different options.  Include at least 1 serving of protein with your meals and snacks. Protein options include meats or poultry,  beans, nuts, eggs, and yogurt. Try eating a protein-rich snack before bed. Examples of these snacks include cheese and crackers or half of a peanut butter or [place]  sandwich.  Consider eliminating foods that trigger your symptoms. These may include spicy foods, coffee, high-fat foods, very sweet foods, and acidic foods.  Try meals that have more protein combined with bland, salty, lower-fat, and dry foods, such as nuts, seeds, pretzels, crackers, and cereal.  Talk with your healthcare provider about starting a supplement of vitamin B6.  Have fluids that are cold, clear, and carbonated or sour. Examples include lemonade, ginger ale, lemon-lime soda, ice water, and sparkling water.  Try lemon or mint tea.  Try brushing your teeth or using a mouth rinse after meals. What should I avoid to reduce my symptoms? Avoiding some of the following things may help reduce your symptoms.  Foods with strong smells. Try eating meals in well-ventilated areas that are free of odors.  Drinking water or other beverages with meals. Try not to drink anything during the 30 minutes before and after your meals.  Drinking more than 1 cup of fluid at a time. Sometimes using a straw helps.  Fried or high-fat foods, such as butter and cream sauces.  Spicy foods.  Skipping meals as best as you can. Nausea can be more intense on an empty stomach. If you cannot tolerate food at that time, do not force it. Try sucking on ice chips or other frozen items, and make up for missed calories later.  Lying down within 2 hours after eating.  Environmental triggers. These may include smoky rooms, closed spaces, rooms with strong smells, warm or humid places, overly loud and noisy rooms, and rooms with motion or flickering lights.  Quick and sudden changes in your movement. This information is not intended to replace advice given to you by your health care provider. Make sure you discuss any questions you have with your health care provider. Document Released: 07/02/2007 Document Revised: 05/03/2016 Document Reviewed: 04/04/2016 Elsevier Interactive Patient Education  2017 [employer].

## 2016-10-12 LAB — CULTURE, OB URINE

## 2016-10-27 ENCOUNTER — Encounter: Payer: Medicaid Other | Admitting: Certified Nurse Midwife

## 2016-10-31 DIAGNOSIS — Z3490 Encounter for supervision of normal pregnancy, unspecified, unspecified trimester: Secondary | ICD-10-CM | POA: Insufficient documentation

## 2016-10-31 LAB — OB RESULTS CONSOLE HIV ANTIBODY (ROUTINE TESTING): HIV: NONREACTIVE

## 2016-10-31 LAB — OB RESULTS CONSOLE ABO/RH: RH TYPE: POSITIVE

## 2016-10-31 LAB — OB RESULTS CONSOLE RPR: RPR: NONREACTIVE

## 2016-10-31 LAB — OB RESULTS CONSOLE GC/CHLAMYDIA
Chlamydia: NEGATIVE
GC PROBE AMP, GENITAL: NEGATIVE

## 2016-10-31 LAB — OB RESULTS CONSOLE RUBELLA ANTIBODY, IGM: Rubella: IMMUNE

## 2016-10-31 LAB — OB RESULTS CONSOLE ANTIBODY SCREEN: Antibody Screen: NEGATIVE

## 2016-10-31 LAB — OB RESULTS CONSOLE HEPATITIS B SURFACE ANTIGEN: Hepatitis B Surface Ag: NEGATIVE

## 2016-11-29 ENCOUNTER — Inpatient Hospital Stay (HOSPITAL_COMMUNITY)
Admission: AD | Admit: 2016-11-29 | Discharge: 2016-11-29 | Disposition: A | Discharge status: Home / Self Care | Payer: Medicaid Other | Source: Ambulatory Visit | Attending: Obstetrics and Gynecology | Admitting: Obstetrics and Gynecology

## 2016-11-29 ENCOUNTER — Ambulatory Visit (HOSPITAL_COMMUNITY)
Admission: EM | Admit: 2016-11-29 | Discharge: 2016-11-29 | Disposition: A | Discharge status: Home / Self Care | Payer: Medicaid Other | Attending: Family Medicine | Admitting: Family Medicine

## 2016-11-29 ENCOUNTER — Encounter (HOSPITAL_COMMUNITY): Payer: Self-pay | Admitting: *Deleted

## 2016-11-29 ENCOUNTER — Encounter (HOSPITAL_COMMUNITY): Payer: Self-pay | Admitting: Family Medicine

## 2016-11-29 DIAGNOSIS — Z3A18 18 weeks gestation of pregnancy: Secondary | ICD-10-CM | POA: Diagnosis not present

## 2016-11-29 DIAGNOSIS — O210 Mild hyperemesis gravidarum: Secondary | ICD-10-CM | POA: Insufficient documentation

## 2016-11-29 DIAGNOSIS — Z87891 Personal history of nicotine dependence: Secondary | ICD-10-CM | POA: Insufficient documentation

## 2016-11-29 DIAGNOSIS — L03011 Cellulitis of right finger: Secondary | ICD-10-CM

## 2016-11-29 LAB — URINALYSIS, ROUTINE W REFLEX MICROSCOPIC
BACTERIA UA: NONE SEEN
BILIRUBIN URINE: NEGATIVE
Glucose, UA: NEGATIVE mg/dL
Hgb urine dipstick: NEGATIVE
Ketones, ur: 20 mg/dL — AB
Nitrite: NEGATIVE
Protein, ur: 30 mg/dL — AB
SPECIFIC GRAVITY, URINE: 1.032 — AB (ref 1.005–1.030)
pH: 6 (ref 5.0–8.0)

## 2016-11-29 MED ORDER — HYDROCODONE-ACETAMINOPHEN 5-325 MG PO TABS: 1.0000 | ORAL_TABLET | ORAL | 0 refills | Status: DC | PRN

## 2016-11-29 MED ORDER — METOCLOPRAMIDE HCL 10 MG PO TABS: 10.0000 mg | ORAL_TABLET | Freq: Three times a day (TID) | ORAL | 1 refills | Status: DC

## 2016-11-29 MED ORDER — CEPHALEXIN 500 MG PO CAPS: 500.0000 mg | ORAL_CAPSULE | Freq: Three times a day (TID) | ORAL | 0 refills | Status: DC

## 2016-11-29 NOTE — MAU Provider Note (Signed)
Patient Kelsey Cooper is a 26 year old G5P1122 at 17 weeks here for a refill on her Reglan. She is a patient of Dr. Mora Appl. History   Patient states that she still feels nauseated after taking Reglan until she ran out last week. She called her doctors's office but they did not have the RX on file and they prescribed her phenergan. She is here because the phenergan makes her sleepy and she wants a refill on her Reglan. She had two refills but she used them all up. The Reglan is working for her and she wants to continue.  Nausea is "easing up but still there." She threw up today and before that a few days ago.   CSN: [id number]  Arrival date and time: 11/29/16 0046   None     Chief Complaint  Patient presents with  . Emesis   HPI  OB History    Gravida Para Term Preterm AB Living   5 2 1 1 2 2    SAB TAB Ectopic Multiple Live Births   1 1     2       Past Medical History:  Diagnosis Date  . Anxiety   . GERD (gastroesophageal reflux disease)   . Severe depression (HCC)     Past Surgical History:  Procedure Laterality Date  . NO PAST SURGERIES      No family history on file.  Social History  Substance Use Topics  . Smoking status: Former Smoker    Packs/day: 1.00    Types: Cigarettes  . Smokeless tobacco: Former Neurosurgeon  . Alcohol use No    Allergies: No Known Allergies  Prescriptions Prior to Admission  Medication Sig Dispense Refill Last Dose  . metoCLOPramide (REGLAN) 10 MG tablet Take 1 tablet (10 mg total) by mouth every 8 (eight) hours as needed for nausea. 30 tablet 2   . Nutritional Supplements (ENSURE HEALTHY MOM) LIQD Take 1 Bottle by mouth 2 (two) times daily. 12 Bottle 0 Past Week at Unknown time  . omeprazole (PRILOSEC) 20 MG capsule Take 1 capsule (20 mg total) by mouth 2 (two) times daily before a meal. 60 capsule 2   . promethazine (PHENERGAN) 25 MG tablet Take 1 tablet (25 mg total) by mouth every 6 (six) hours as needed for nausea or vomiting. 30 tablet 1  Past Week at Unknown time    Review of Systems  Constitutional: Negative.   HENT: Negative.   Eyes: Negative.   Respiratory: Negative.   Cardiovascular: Negative.   Gastrointestinal: Negative.   Genitourinary: Negative.   Neurological: Negative.    Physical Exam   Blood pressure 95/55, pulse 87, temperature 98.3 F (36.8 C), temperature source Oral, resp. rate 20, height 5\' 5"  (1.651 m), weight 60.2 kg (132 lb 12 oz), last menstrual period 07/19/2016, unknown if currently breastfeeding.  Physical Exam  Constitutional: She is oriented to person, place, and time. She appears well-developed and well-nourished.  Eyes: Pupils are equal, round, and reactive to light.  Neck: Normal range of motion.  Respiratory: Effort normal.  Musculoskeletal: Normal range of motion.  Neurological: She is alert and oriented to person, place, and time. She has normal reflexes.  Skin: Skin is warm and dry.  Psychiatric: She has a normal mood and affect.   Patient ambulating in the waiting room, talking on her phone.  MAU Course  Procedures  MDM -discussed importance of high protein diet and never letting her stomach get empty in order to prevent nausea.  -  FHR is 145 by doppler Assessment and Plan   1. Morning sickness    2. Patient sent RX for Reglan and encouraged to keep her Ob-GYN appointment on 3-19. Reviewed when to return to MAU (can't keep anything down, bleeding, leaking of fluid). Plan of care discussed with Dr. Henderson CloudHorvath, who agrees.    Charlesetta GaribaldiKathryn Lorraine Kooistra CNM 11/29/2016, 2:25 AM

## 2016-11-29 NOTE — Discharge Instructions (Signed)
We will apply a bandage this evening. He may continue to soak it in warm salt water over the next day or 2 or 3 times a day. The stab wound should heal up in about 2 days. He may take the medications as prescribed. They should be safe during pregnancy. Do not apply any chemicals, paint or other substances to the nail until it is completely healed. Only take the prescribed pain medicine if you absolutely need it.

## 2016-11-29 NOTE — ED Provider Notes (Signed)
CSN: [id number]     Arrival date & time 11/29/16  1842 History   First MD Initiated Contact with Patient 11/29/16 2008     Chief Complaint  Patient presents with  . Hand Pain   (Consider location/radiation/quality/duration/timing/severity/associated sxs/prior Treatment) 26 year old female states that 2 weeks after she had nail treatment at the nail salon she developed some redness swelling and tenderness rounding the fingernail of the right fifth digit. She states that she pulled the fake nail off and shortly after that she saw her pus was forming along the nail hemorrhage.      Past Medical History:  Diagnosis Date  . Anxiety   . GERD (gastroesophageal reflux disease)   . Severe depression (HCC)    Past Surgical History:  Procedure Laterality Date  . NO PAST SURGERIES     History reviewed. No pertinent family history. Social History  Substance Use Topics  . Smoking status: Former Smoker    Packs/day: 1.00    Types: Cigarettes  . Smokeless tobacco: Former Neurosurgeon  . Alcohol use No   OB History    Gravida Para Term Preterm AB Living   5 2 1 1 2 2    SAB TAB Ectopic Multiple Live Births   1 1     2      Review of Systems  Constitutional: Negative.   Skin:       Spread history of present illness  Neurological: Negative.   All other systems reviewed and are negative.   Allergies  Patient has no known allergies.  Home Medications   Prior to Admission medications   Medication Sig Start Date End Date Taking? Authorizing Provider  cephALEXin (KEFLEX) 500 MG capsule Take 1 capsule (500 mg total) by mouth 3 (three) times daily. 11/29/16   Hayden Rasmussen, NP  HYDROcodone-acetaminophen (NORCO/VICODIN) 5-325 MG tablet Take 1 tablet by mouth every 4 (four) hours as needed. 11/29/16   Hayden Rasmussen, NP  metoCLOPramide (REGLAN) 10 MG tablet Take 1 tablet (10 mg total) by mouth every 8 (eight) hours as needed for nausea. 10/11/16   Marny Lowenstein, PA-C  metoCLOPramide (REGLAN) 10 MG tablet  Take 1 tablet (10 mg total) by mouth 3 (three) times daily with meals. 11/29/16 11/29/17  Marylene Land, CNM  Nutritional Supplements (ENSURE HEALTHY MOM) LIQD Take 1 Bottle by mouth 2 (two) times daily. 09/21/16   Hope Orlene Och, NP  omeprazole (PRILOSEC) 20 MG capsule Take 1 capsule (20 mg total) by mouth 2 (two) times daily before a meal. 10/11/16   Marny Lowenstein, PA-C   Meds Ordered and Administered this Visit  Medications - No data to display  BP 115/63   Pulse 67   Temp 97.8 F (36.6 C)   Resp 18   LMP 07/19/2016 (Within Weeks)   SpO2 100%  No data found.   Physical Exam  Constitutional: She is oriented to person, place, and time. She appears well-developed and well-nourished. No distress.  Eyes: EOM are normal.  Neck: Neck supple.  Cardiovascular: Normal rate.   Pulmonary/Chest: Effort normal. No respiratory distress.  Musculoskeletal: She exhibits no edema.  Able to flex and extend the affected fifth digit normally. Sensation intact. Erythema, swelling and tenderness along the base of the fingernail. Signs did not extend to the joint.   Neurological: She is alert and oriented to person, place, and time. She exhibits normal muscle tone.  Skin: Skin is warm and dry.  Psychiatric: She has a normal mood and affect.  Nursing note and vitals reviewed.   Urgent Care Course     .Marland Kitchen.Incision and Drainage Date/Time: 11/29/2016 8:26 PM Performed by: Phineas RealMABE, Mykal Batiz Authorized by: Hazeline JunkerGRUNZ, RYAN B   Consent:    Consent obtained:  Verbal   Consent given by:  Patient   Risks discussed:  Bleeding and pain Location:    Location:  Upper extremity   Upper extremity location:  Finger   Finger location:  R small finger Pre-procedure details:    Skin preparation:  Betadine Anesthesia (see MAR for exact dosages):    Anesthesia method:  Topical application Procedure type:    Complexity:  Simple Procedure details:    Incision types:  Single straight   Incision depth:  Dermal    Scalpel blade:  11   Drainage:  Purulent   Drainage amount:  Moderate   Wound treatment:  Wound left open Post-procedure details:    Patient tolerance of procedure:  Tolerated well, no immediate complications Comments:     This was a paronychia in which cold spray was used as anesthesia. A #11 blade was tangentially introduced into the pale, purulent/fluctuant area of the swelling at the base of the nail.   (including critical care time)  Labs Review Labs Reviewed - No data to display  Imaging Review No results found.   Visual Acuity Review  Right Eye Distance:   Left Eye Distance:   Bilateral Distance:    Right Eye Near:   Left Eye Near:    Bilateral Near:         MDM   1. Paronychia of right little finger    We will apply a bandage this evening. He may continue to soak it in warm salt water over the next day or 2 or 3 times a day. The stab wound should heal up in about 2 days. He may take the medications as prescribed. They should be safe during pregnancy. Do not apply any chemicals, paint or other substances to the nail until it is completely healed. Only take the prescribed pain medicine if you absolutely need it. Meds ordered this encounter  Medications  . cephALEXin (KEFLEX) 500 MG capsule    Sig: Take 1 capsule (500 mg total) by mouth 3 (three) times daily.    Dispense:  16 capsule    Refill:  0    Order Specific Question:   Supervising Provider    Answer:   Tyrone NineGRUNZ, RYAN B [id number]]  . HYDROcodone-acetaminophen (NORCO/VICODIN) 5-325 MG tablet    Sig: Take 1 tablet by mouth every 4 (four) hours as needed.    Dispense:  10 tablet    Refill:  0    Order Specific Question:   Supervising Provider    Answer:   Oren BeckmannGRUNZ, RYAN B [[id number]]       Tanya Crothers, NP 11/29/16 2038

## 2016-11-29 NOTE — MAU Note (Signed)
PT  SAYS SHE HAS  LOWER ABD  PAIN -  X1  MTH     -   STILL HAS  VOMITING   -HAS USED    REGLAN-    FINISHED.         ALSO RIGHT  PINKIE    IS SWOLLEN   - RED

## 2016-11-29 NOTE — ED Triage Notes (Signed)
Pt here for right pinky finger swelling. sts she had some fake nails and had to take them off.

## 2017-02-03 ENCOUNTER — Inpatient Hospital Stay (HOSPITAL_COMMUNITY)
Admission: AD | Admit: 2017-02-03 | Discharge: 2017-02-04 | Disposition: A | Discharge status: Home / Self Care | Payer: Medicaid Other | Source: Ambulatory Visit | Attending: Obstetrics & Gynecology | Admitting: Obstetrics & Gynecology

## 2017-02-03 DIAGNOSIS — R109 Unspecified abdominal pain: Secondary | ICD-10-CM | POA: Diagnosis not present

## 2017-02-03 DIAGNOSIS — Z87891 Personal history of nicotine dependence: Secondary | ICD-10-CM | POA: Insufficient documentation

## 2017-02-03 DIAGNOSIS — Z3A26 26 weeks gestation of pregnancy: Secondary | ICD-10-CM | POA: Diagnosis not present

## 2017-02-03 DIAGNOSIS — R1030 Lower abdominal pain, unspecified: Secondary | ICD-10-CM | POA: Diagnosis present

## 2017-02-03 DIAGNOSIS — O26892 Other specified pregnancy related conditions, second trimester: Secondary | ICD-10-CM

## 2017-02-03 LAB — URINALYSIS, ROUTINE W REFLEX MICROSCOPIC
BILIRUBIN URINE: NEGATIVE
GLUCOSE, UA: NEGATIVE mg/dL
HGB URINE DIPSTICK: NEGATIVE
KETONES UR: NEGATIVE mg/dL
NITRITE: NEGATIVE
PROTEIN: NEGATIVE mg/dL
Specific Gravity, Urine: 1.019 (ref 1.005–1.030)
pH: 7 (ref 5.0–8.0)

## 2017-02-03 LAB — WET PREP, GENITAL
Clue Cells Wet Prep HPF POC: NONE SEEN
Sperm: NONE SEEN
Trich, Wet Prep: NONE SEEN
Yeast Wet Prep HPF POC: NONE SEEN

## 2017-02-03 NOTE — MAU Note (Addendum)
Having pain in abd for about a month. Using maternity belt but makes it worse. See spots of pink blood when I wipe today. Does not remember when had last BM

## 2017-02-03 NOTE — MAU Provider Note (Signed)
History     CSN: [id number]  Arrival date and time: 02/03/17 2214   First Provider Initiated Contact with Patient 02/03/17 2259      Chief Complaint  Patient presents with  . Abdominal Pain  . Vaginal Bleeding   HPI [name] is a 26 y.o. [id number] at 3166w3d who presents to MAU today with complaint of abdominal pain. The patient states lower abdominal pain has been present every night for the last month. She was told that she has round ligament pain, but feels this may be different. She states some contractions intermittently. She has tried the abdominal binder without relief. She reports spotting today noted with wiping. She denies recent intercourse. She reports normal fetal movement.   OB History    Gravida Para Term Preterm AB Living   5 2 1 1 2 2    SAB TAB Ectopic Multiple Live Births   1 1     2       Past Medical History:  Diagnosis Date  . Anxiety   . GERD (gastroesophageal reflux disease)   . Severe depression (HCC)     Past Surgical History:  Procedure Laterality Date  . NO PAST SURGERIES      No family history on file.  Social History  Substance Use Topics  . Smoking status: Former Smoker    Packs/day: 1.00    Types: Cigarettes  . Smokeless tobacco: Former NeurosurgeonUser  . Alcohol use No    Allergies: No Known Allergies  No prescriptions prior to admission.    Review of Systems  Constitutional: Negative for fever.  Gastrointestinal: Positive for abdominal pain. Negative for constipation, diarrhea, nausea and vomiting.  Genitourinary: Positive for vaginal bleeding. Negative for vaginal discharge.   Physical Exam   Blood pressure (!) 103/55, pulse 75, resp. rate 18, height 4\' 11"  (1.499 m), weight 150 lb (68 kg), last menstrual period 07/19/2016, unknown if currently breastfeeding.  Physical Exam  Nursing note and vitals reviewed. Constitutional: She is oriented to person, place, and time. She appears well-developed and well-nourished. No distress.   HENT:  Head: Normocephalic and atraumatic.  Cardiovascular: Normal rate.   Respiratory: Effort normal.  GI: Soft. She exhibits no distension and no mass. There is no tenderness. There is no rebound and no guarding.  Genitourinary: Uterus is enlarged (gravid). Uterus is not tender. Cervix exhibits no motion tenderness, no discharge and no friability. No bleeding in the vagina. Vaginal discharge (small amount of thin, white discharge) found.  Neurological: She is alert and oriented to person, place, and time.  Skin: Skin is warm and dry. No erythema.  Psychiatric: She has a normal mood and affect.  Dilation: Closed Effacement (%): Thick Exam by:: Vonzella NippleJulie Lulani Bour PA-C   Results for orders placed or performed during the hospital encounter of 02/03/17 (from the past 24 hour(s))  Urinalysis, Routine w reflex microscopic     Status: Abnormal   Collection Time: 02/03/17 10:35 PM  Result Value Ref Range   Color, Urine YELLOW YELLOW   APPearance CLEAR CLEAR   Specific Gravity, Urine 1.019 1.005 - 1.030   pH 7.0 5.0 - 8.0   Glucose, UA NEGATIVE NEGATIVE mg/dL   Hgb urine dipstick NEGATIVE NEGATIVE   Bilirubin Urine NEGATIVE NEGATIVE   Ketones, ur NEGATIVE NEGATIVE mg/dL   Protein, ur NEGATIVE NEGATIVE mg/dL   Nitrite NEGATIVE NEGATIVE   Leukocytes, UA TRACE (A) NEGATIVE   RBC / HPF 0-5 0 - 5 RBC/hpf   WBC, UA 0-5 0 -  5 WBC/hpf   Bacteria, UA RARE (A) NONE SEEN   Squamous Epithelial / LPF 0-5 (A) NONE SEEN   Mucous PRESENT   Wet prep, genital     Status: Abnormal   Collection Time: 02/03/17 11:11 PM  Result Value Ref Range   Yeast Wet Prep HPF POC NONE SEEN NONE SEEN   Trich, Wet Prep NONE SEEN NONE SEEN   Clue Cells Wet Prep HPF POC NONE SEEN NONE SEEN   WBC, Wet Prep HPF POC FEW (A) NONE SEEN   Sperm NONE SEEN      MAU Course  Procedures None  MDM FHR - 153 bpm with doppler Discussed patient with Dr. Mora Appl. Ok for discharge at this time with preterm labor precautions. Follow-up  in the office.   Assessment and Plan  A: SIUP at [redacted]w[redacted]d Abdominal pain in pregnancy, second trimester   P: Discharge home Tylenol PRN for pain Advised continued use of abdominal binder  Preterm labor precautions discussed Patient advised to follow-up with [facility] as scheduled for  routine prenatal care or sooner PRN  Patient may return to MAU as needed or if her condition were to change or worsen   Vonzella Nipple, PA-C 02/04/2017, 1:56 AM

## 2017-02-03 NOTE — Discharge Instructions (Signed)
Abdominal Pain During Pregnancy °Belly (abdominal) pain is common during pregnancy. Most of the time, it is not a serious problem. Other times, it can be a sign that something is wrong with the pregnancy. Always tell your doctor if you have belly pain. °Follow these instructions at home: °Monitor your belly pain for any changes. The following actions may help you feel better: °· Do not have sex (intercourse) or put anything in your vagina until you feel better. °· Rest until your pain stops. °· Drink clear fluids if you feel sick to your stomach (nauseous). Do not eat solid food until you feel better. °· Only take medicine as told by your doctor. °· Keep all doctor visits as told. °Get help right away if: °· You are bleeding, leaking fluid, or pieces of tissue come out of your vagina. °· You have more pain or cramping. °· You keep throwing up (vomiting). °· You have pain when you pee (urinate) or have blood in your pee. °· You have a fever. °· You do not feel your baby moving as much. °· You feel very weak or feel like passing out. °· You have trouble breathing, with or without belly pain. °· You have a very bad headache and belly pain. °· You have fluid leaking from your vagina and belly pain. °· You keep having watery poop (diarrhea). °· Your belly pain does not go away after resting, or the pain gets worse. °This information is not intended to replace advice given to you by your health care provider. Make sure you discuss any questions you have with your health care provider. °Document Released: 08/23/2009 Document Revised: 04/12/2016 Document Reviewed: 04/03/2013 °Elsevier Interactive Patient Education © 2017 Elsevier Inc. ° °

## 2017-02-05 LAB — GC/CHLAMYDIA PROBE AMP (~~LOC~~) NOT AT ARMC
Chlamydia: NEGATIVE
NEISSERIA GONORRHEA: NEGATIVE

## 2017-02-17 ENCOUNTER — Other Ambulatory Visit: Payer: Self-pay | Admitting: Medical

## 2017-03-11 ENCOUNTER — Encounter (HOSPITAL_COMMUNITY): Payer: Self-pay | Admitting: Emergency Medicine

## 2017-03-11 ENCOUNTER — Emergency Department (HOSPITAL_COMMUNITY)
Admission: EM | Admit: 2017-03-11 | Discharge: 2017-03-11 | Disposition: A | Discharge status: Home / Self Care | Payer: Medicaid Other | Attending: Emergency Medicine | Admitting: Emergency Medicine

## 2017-03-11 DIAGNOSIS — Z87891 Personal history of nicotine dependence: Secondary | ICD-10-CM | POA: Diagnosis not present

## 2017-03-11 DIAGNOSIS — O99283 Endocrine, nutritional and metabolic diseases complicating pregnancy, third trimester: Secondary | ICD-10-CM | POA: Diagnosis not present

## 2017-03-11 DIAGNOSIS — E860 Dehydration: Secondary | ICD-10-CM | POA: Insufficient documentation

## 2017-03-11 DIAGNOSIS — N390 Urinary tract infection, site not specified: Secondary | ICD-10-CM

## 2017-03-11 DIAGNOSIS — O2343 Unspecified infection of urinary tract in pregnancy, third trimester: Secondary | ICD-10-CM | POA: Insufficient documentation

## 2017-03-11 DIAGNOSIS — Z79899 Other long term (current) drug therapy: Secondary | ICD-10-CM | POA: Diagnosis not present

## 2017-03-11 DIAGNOSIS — Z3A31 31 weeks gestation of pregnancy: Secondary | ICD-10-CM | POA: Diagnosis not present

## 2017-03-11 DIAGNOSIS — E876 Hypokalemia: Secondary | ICD-10-CM | POA: Diagnosis not present

## 2017-03-11 DIAGNOSIS — O26893 Other specified pregnancy related conditions, third trimester: Secondary | ICD-10-CM | POA: Diagnosis present

## 2017-03-11 LAB — URINALYSIS, ROUTINE W REFLEX MICROSCOPIC
Bilirubin Urine: NEGATIVE
GLUCOSE, UA: NEGATIVE mg/dL
HGB URINE DIPSTICK: NEGATIVE
Ketones, ur: 20 mg/dL — AB
Nitrite: NEGATIVE
PH: 7 (ref 5.0–8.0)
PROTEIN: NEGATIVE mg/dL
Specific Gravity, Urine: 1.018 (ref 1.005–1.030)

## 2017-03-11 LAB — CBC WITH DIFFERENTIAL/PLATELET
Basophils Absolute: 0 10*3/uL (ref 0.0–0.1)
Basophils Relative: 0 %
Eosinophils Absolute: 0 10*3/uL (ref 0.0–0.7)
Eosinophils Relative: 0 %
HCT: 30.4 % — ABNORMAL LOW (ref 36.0–46.0)
Hemoglobin: 10.8 g/dL — ABNORMAL LOW (ref 12.0–15.0)
LYMPHS ABS: 1.9 10*3/uL (ref 0.7–4.0)
LYMPHS PCT: 29 %
MCH: 31.9 pg (ref 26.0–34.0)
MCHC: 35.5 g/dL (ref 30.0–36.0)
MCV: 89.7 fL (ref 78.0–100.0)
MONO ABS: 0.4 10*3/uL (ref 0.1–1.0)
MONOS PCT: 6 %
Neutro Abs: 4.3 10*3/uL (ref 1.7–7.7)
Neutrophils Relative %: 65 %
PLATELETS: 239 10*3/uL (ref 150–400)
RBC: 3.39 MIL/uL — ABNORMAL LOW (ref 3.87–5.11)
RDW: 12.9 % (ref 11.5–15.5)
WBC: 6.7 10*3/uL (ref 4.0–10.5)

## 2017-03-11 LAB — COMPREHENSIVE METABOLIC PANEL
ALT: 16 U/L (ref 14–54)
AST: 16 U/L (ref 15–41)
Albumin: 2.9 g/dL — ABNORMAL LOW (ref 3.5–5.0)
Alkaline Phosphatase: 98 U/L (ref 38–126)
Anion gap: 6 (ref 5–15)
BILIRUBIN TOTAL: 0.4 mg/dL (ref 0.3–1.2)
BUN: 10 mg/dL (ref 6–20)
CHLORIDE: 108 mmol/L (ref 101–111)
CO2: 23 mmol/L (ref 22–32)
CREATININE: 0.62 mg/dL (ref 0.44–1.00)
Calcium: 8.4 mg/dL — ABNORMAL LOW (ref 8.9–10.3)
Glucose, Bld: 96 mg/dL (ref 65–99)
POTASSIUM: 3.4 mmol/L — AB (ref 3.5–5.1)
Sodium: 137 mmol/L (ref 135–145)
Total Protein: 5.8 g/dL — ABNORMAL LOW (ref 6.5–8.1)

## 2017-03-11 MED ORDER — METOCLOPRAMIDE HCL 5 MG/ML IJ SOLN
10.0000 mg | Freq: Once | INTRAMUSCULAR | Status: AC
  Administered 2017-03-11: 10 mg via INTRAVENOUS
  Filled 2017-03-11: qty 2

## 2017-03-11 MED ORDER — DIPHENHYDRAMINE HCL 50 MG/ML IJ SOLN
25.0000 mg | Freq: Once | INTRAMUSCULAR | Status: AC
  Administered 2017-03-11: 25 mg via INTRAVENOUS
  Filled 2017-03-11: qty 1

## 2017-03-11 MED ORDER — POTASSIUM CHLORIDE CRYS ER 20 MEQ PO TBCR
40.0000 meq | EXTENDED_RELEASE_TABLET | Freq: Once | ORAL | Status: AC
  Administered 2017-03-11: 40 meq via ORAL
  Filled 2017-03-11: qty 2

## 2017-03-11 MED ORDER — CEFPODOXIME PROXETIL 100 MG PO TABS
100.0000 mg | ORAL_TABLET | Freq: Two times a day (BID) | ORAL | 0 refills | Status: AC
Start: 2017-03-11 — End: 2017-03-18

## 2017-03-11 MED ORDER — POTASSIUM CHLORIDE CRYS ER 20 MEQ PO TBCR: 20.0000 meq | EXTENDED_RELEASE_TABLET | Freq: Two times a day (BID) | ORAL | 0 refills | Status: DC

## 2017-03-11 MED ORDER — CYCLOBENZAPRINE HCL 10 MG PO TABS
10.0000 mg | ORAL_TABLET | Freq: Once | ORAL | Status: AC
  Administered 2017-03-11: 10 mg via ORAL
  Filled 2017-03-11: qty 1

## 2017-03-11 MED ORDER — LACTATED RINGERS IV BOLUS (SEPSIS)
1000.0000 mL | Freq: Once | INTRAVENOUS | Status: AC
  Administered 2017-03-11: 1000 mL via INTRAVENOUS

## 2017-03-11 NOTE — ED Notes (Signed)
OB rapid response has been called

## 2017-03-11 NOTE — ED Triage Notes (Signed)
Pt reports she has had lower abd pain for the past week. Pt 8 months pregnant. Decided to come in today because she is having generalized body pain. No new nausea. No diarrhea.

## 2017-03-11 NOTE — ED Notes (Signed)
Pt aware urine sample is needed 

## 2017-03-11 NOTE — ED Notes (Signed)
Pt states that her body aches are a 9/10 and her lower abdominal pain is a 4/10.

## 2017-03-11 NOTE — ED Notes (Signed)
Rapid OB at bedside 

## 2017-03-11 NOTE — Progress Notes (Signed)
Spoke with Dr. Tenny Crawoss. Pt is a G5P2 at 31 4/[redacted] weeks gestation here with c/o body aches and vaginal pressure. No vaginal bleeding or leaking of fluid. FHR tracing is a category 1 with no uc's. Pt is afebrile. ED MD has ordered IVF, labs, U/A, and EKG. Pt is OB cleared.

## 2017-03-11 NOTE — ED Notes (Signed)
Pt able to tolerate PO fluid without difficulty.  

## 2017-03-11 NOTE — ED Notes (Signed)
Pt reminded of the need for a urine sample.  

## 2017-03-11 NOTE — ED Provider Notes (Signed)
[facility]-EMERGENCY DEPT Provider Note   CSN: [id number] Arrival date & time: 03/11/17  1556     History   Chief Complaint Chief Complaint  Patient presents with  . Abdominal Pain    57mo Pregnant    HPI Kelsey Cooper is a 26 y.o. female.  HPI   26 yo G5P2 at estimated 31w here with abd pain and fatigue. Pt reports that over the past several weeks she has had progressively worsening generalized fatigue. She has had nausea, felt tired, and had poor appetite. She has not lost weight however. Over the past 2 days, pt reports generalized body aches, chills, and worsening fatigue. She has felt like she can't get out of bed due to general weakness. She also reports mild urinary urgency and frequency. No dysuria, however. No flank pain. No vaginal bleeding or discharge. No contractions but pt does reportedly have a h/o precipitous delivery.  Past Medical History:  Diagnosis Date  . Anxiety   . GERD (gastroesophageal reflux disease)   . Severe depression (HCC)     There are no active problems to display for this patient.   Past Surgical History:  Procedure Laterality Date  . NO PAST SURGERIES      OB History    Gravida Para Term Preterm AB Living   5 2 1 1 2 2    SAB TAB Ectopic Multiple Live Births   1 1     2        Home Medications    Prior to Admission medications   Medication Sig Start Date End Date Taking? Authorizing Provider  metoCLOPramide (REGLAN) 10 MG tablet Take 1 tablet (10 mg total) by mouth every 8 (eight) hours as needed for nausea. 10/11/16  Yes Marny Lowenstein, PA-C  cefpodoxime (VANTIN) 100 MG tablet Take 1 tablet (100 mg total) by mouth 2 (two) times daily. 03/11/17 03/18/17  Shaune Pollack, MD  HYDROcodone-acetaminophen (NORCO/VICODIN) 5-325 MG tablet Take 1 tablet by mouth every 4 (four) hours as needed. Patient not taking: Reported on 03/11/2017 11/29/16   Hayden Rasmussen, NP  Nutritional Supplements (ENSURE HEALTHY MOM) LIQD Take 1 Bottle by mouth 2 (two) times  daily. Patient not taking: Reported on 03/11/2017 09/21/16   Janne Napoleon, NP  omeprazole (PRILOSEC) 20 MG capsule TAKE ONE CAPSULE BY MOUTH TWICE A DAY BEFORE MEALS Patient not taking: Reported on 03/11/2017 02/19/17   Marny Lowenstein, PA-C  potassium chloride SA (K-DUR,KLOR-CON) 20 MEQ tablet Take 1 tablet (20 mEq total) by mouth 2 (two) times daily. 03/11/17 03/13/17  Shaune Pollack, MD    Family History History reviewed. No pertinent family history.  Social History Social History  Substance Use Topics  . Smoking status: Former Smoker    Packs/day: 1.00    Types: Cigarettes  . Smokeless tobacco: Former Neurosurgeon  . Alcohol use No     Allergies   Patient has no known allergies.   Review of Systems Review of Systems  Constitutional: Positive for chills and fatigue. Negative for fever.  HENT: Negative for congestion and rhinorrhea.   Eyes: Negative for visual disturbance.  Respiratory: Negative for cough, shortness of breath and wheezing.   Cardiovascular: Negative for chest pain and leg swelling.  Gastrointestinal: Positive for abdominal pain and nausea. Negative for diarrhea and vomiting.  Genitourinary: Negative for dysuria and flank pain.  Musculoskeletal: Negative for neck pain and neck stiffness.  Skin: Negative for rash and wound.  Allergic/Immunologic: Negative for immunocompromised state.  Neurological: Positive for weakness and  light-headedness. Negative for syncope and headaches.  All other systems reviewed and are negative.    Physical Exam Updated Vital Signs BP 123/78 (BP Location: Right Arm)   Pulse 88   Temp 97.6 F (36.4 C) (Oral)   Resp 14   LMP 07/19/2016 (Within Weeks)   SpO2 100%   Physical Exam  Constitutional: She is oriented to person, place, and time. She appears well-developed and well-nourished. No distress.  HENT:  Head: Normocephalic and atraumatic.  Mildly dry MM  Eyes: Conjunctivae are normal.  Neck: Neck supple.  Cardiovascular: Normal  rate, regular rhythm and normal heart sounds.  Exam reveals no friction rub.   No murmur heard. Pulmonary/Chest: Effort normal and breath sounds normal. No respiratory distress. She has no wheezes. She has no rales.  Abdominal: She exhibits no distension.  Gravid. Mild suprapubic TTP. No palpable CTX.  Genitourinary:  Genitourinary Comments: 1/thick/high per OB rapid response RN  Musculoskeletal: She exhibits no edema.  Neurological: She is alert and oriented to person, place, and time. She exhibits normal muscle tone.  Skin: Skin is warm. Capillary refill takes less than 2 seconds.  Psychiatric: She has a normal mood and affect.  Nursing note and vitals reviewed.    ED Treatments / Results  Labs (all labs ordered are listed, but only abnormal results are displayed) Labs Reviewed  CBC WITH DIFFERENTIAL/PLATELET - Abnormal; Notable for the following:       Result Value   RBC 3.39 (*)    Hemoglobin 10.8 (*)    HCT 30.4 (*)    All other components within normal limits  COMPREHENSIVE METABOLIC PANEL - Abnormal; Notable for the following:    Potassium 3.4 (*)    Calcium 8.4 (*)    Total Protein 5.8 (*)    Albumin 2.9 (*)    All other components within normal limits  URINALYSIS, ROUTINE W REFLEX MICROSCOPIC - Abnormal; Notable for the following:    Ketones, ur 20 (*)    Leukocytes, UA TRACE (*)    Bacteria, UA RARE (*)    Squamous Epithelial / LPF 0-5 (*)    All other components within normal limits    EKG  EKG Interpretation  Date/Time:  Sunday March 11 2017 17:58:07 EDT Ventricular Rate:  81 PR Interval:    QRS Duration: 73 QT Interval:  369 QTC Calculation: 429 R Axis:   64 Text Interpretation:  Sinus rhythm Low voltage, precordial leads No acute changes Confirmed by Shaune PollackIsaacs, Jameriah Trotti [phone]) on 03/11/2017 6:39:57 PM       Radiology No results found.  Procedures Procedures (including critical care time)  Medications Ordered in ED Medications  lactated ringers  bolus 1,000 mL (0 mLs Intravenous Stopped 03/11/17 1830)  lactated ringers bolus 1,000 mL (1,000 mLs Intravenous New Bag/Given 03/11/17 1843)  potassium chloride SA (K-DUR,KLOR-CON) CR tablet 40 mEq (40 mEq Oral Given 03/11/17 1843)  cyclobenzaprine (FLEXERIL) tablet 10 mg (10 mg Oral Given 03/11/17 1925)  metoCLOPramide (REGLAN) injection 10 mg (10 mg Intravenous Given 03/11/17 1933)  diphenhydrAMINE (BENADRYL) injection 25 mg (25 mg Intravenous Given 03/11/17 1933)  diphenhydrAMINE (BENADRYL) injection 25 mg (25 mg Intravenous Given 03/11/17 1950)     Initial Impression / Assessment and Plan / ED Course  I have reviewed the triage vital signs and the nursing notes.  Pertinent labs & imaging results that were available during my care of the patient were reviewed by me and considered in my medical decision making (see chart for details).  26 yo G5P2 at estimated [redacted] wk GA here with body aches, fatigue. On arrival, pt appears mildly dehydrated. Lab work shows mild hypokalemia, possible UTI with +Bacteriua, ketones. Suspect general fatigue 2/2 dehydration, UTi in pregnancy. No leukocytosis, no flank pain or signs of pyelonephritis. Pt seen by OBGYN RN. Tocometer and FHM all within normal limits, no ctx, FHR is reassuring and pt cleared by OB for medical dispo. Pt given 2L IVF, antiemetics here with imrpovement in sx. Will d/c with ABX, outpt follow-up, and encouraged fluids. Potassium given for mild hypokalemia.  Final Clinical Impressions(s) / ED Diagnoses   Final diagnoses:  Dehydration  Lower urinary tract infectious disease  Hypokalemia    New Prescriptions New Prescriptions   CEFPODOXIME (VANTIN) 100 MG TABLET    Take 1 tablet (100 mg total) by mouth 2 (two) times daily.   POTASSIUM CHLORIDE SA (K-DUR,KLOR-CON) 20 MEQ TABLET    Take 1 tablet (20 mEq total) by mouth 2 (two) times daily.     Shaune Pollack, MD 03/11/17 2019

## 2017-03-11 NOTE — ED Notes (Signed)
MD made aware that pt feels nauseous.

## 2017-03-11 NOTE — ED Notes (Signed)
Pt ambulated to and from restroom without difficulty.   

## 2017-03-11 NOTE — ED Notes (Signed)
Pt denies contractions, but states "I don't really know what those feel like. I didn't have them with my other 2 kids. They both came really fast. My last one I had in my grandma's bed."

## 2017-03-11 NOTE — Progress Notes (Signed)
Pt  Is a G5 p2 at 31 4/[redacted] weeks gestation here with c/o body aches and vaginal pressure. Pt says that her other 2 babies were vaginal deliveries. Not sure if they were delivered at term. [facility]. Denies any problems with this pregnancy.

## 2017-03-30 ENCOUNTER — Inpatient Hospital Stay (HOSPITAL_COMMUNITY)
Admission: AD | Admit: 2017-03-30 | Discharge: 2017-03-30 | Disposition: A | Discharge status: Home / Self Care | Payer: Medicaid Other | Source: Ambulatory Visit | Attending: Obstetrics and Gynecology | Admitting: Obstetrics and Gynecology

## 2017-03-30 ENCOUNTER — Encounter (HOSPITAL_COMMUNITY): Payer: Self-pay | Admitting: *Deleted

## 2017-03-30 DIAGNOSIS — O219 Vomiting of pregnancy, unspecified: Secondary | ICD-10-CM | POA: Diagnosis not present

## 2017-03-30 DIAGNOSIS — O212 Late vomiting of pregnancy: Secondary | ICD-10-CM | POA: Diagnosis not present

## 2017-03-30 DIAGNOSIS — O4703 False labor before 37 completed weeks of gestation, third trimester: Secondary | ICD-10-CM

## 2017-03-30 DIAGNOSIS — O479 False labor, unspecified: Secondary | ICD-10-CM

## 2017-03-30 DIAGNOSIS — R109 Unspecified abdominal pain: Secondary | ICD-10-CM | POA: Diagnosis present

## 2017-03-30 DIAGNOSIS — Z87891 Personal history of nicotine dependence: Secondary | ICD-10-CM | POA: Insufficient documentation

## 2017-03-30 DIAGNOSIS — Z3A34 34 weeks gestation of pregnancy: Secondary | ICD-10-CM

## 2017-03-30 LAB — URINALYSIS, ROUTINE W REFLEX MICROSCOPIC
Bilirubin Urine: NEGATIVE
GLUCOSE, UA: NEGATIVE mg/dL
HGB URINE DIPSTICK: NEGATIVE
Ketones, ur: 5 mg/dL — AB
NITRITE: NEGATIVE
PROTEIN: 30 mg/dL — AB
SPECIFIC GRAVITY, URINE: 1.031 — AB (ref 1.005–1.030)
pH: 6 (ref 5.0–8.0)

## 2017-03-30 MED ORDER — ONDANSETRON 8 MG PO TBDP
8.0000 mg | ORAL_TABLET | Freq: Once | ORAL | Status: AC
  Administered 2017-03-30: 8 mg via ORAL
  Filled 2017-03-30: qty 1

## 2017-03-30 MED ORDER — ONDANSETRON 4 MG PO TBDP: 4.0000 mg | ORAL_TABLET | Freq: Three times a day (TID) | ORAL | 0 refills | Status: DC | PRN

## 2017-03-30 NOTE — MAU Provider Note (Signed)
History     CSN: [id number]  Arrival date and time: 03/30/17 [id number]  First Provider Initiated Contact with Patient 03/30/17 2012      Chief Complaint  Patient presents with  . Abdominal Pain   HPI Kelsey Cooper is a 26 y.o. [id number] at 5775w2d who presents with abdominal pain. Symptoms began 3 days ago. Reports feeling abdominal pain with associated abdominal tightening 2-3 times per hour. Rates pain 5/10. Pain worse with walking. Endorses nausea; no vomiting today but decreased appetite d/t nausea. States she is out of her antiemetic (zofran?). Has been taking zantac for the last 3 nights. Has only had 1 cup of water today. Denies dysuria, diarrhea, vaginal bleeding, or LOF. Positive fetal movement. Has appt in office next week.   OB History    Gravida Para Term Preterm AB Living   5 2 1 1 2 2    SAB TAB Ectopic Multiple Live Births   1 1     2       Past Medical History:  Diagnosis Date  . Anxiety   . GERD (gastroesophageal reflux disease)   . Severe depression (HCC)     Past Surgical History:  Procedure Laterality Date  . NO PAST SURGERIES      History reviewed. No pertinent family history.  Social History  Substance Use Topics  . Smoking status: Former Smoker    Packs/day: 1.00    Types: Cigarettes  . Smokeless tobacco: Former NeurosurgeonUser  . Alcohol use No    Allergies: No Known Allergies  Prescriptions Prior to Admission  Medication Sig Dispense Refill Last Dose  . HYDROcodone-acetaminophen (NORCO/VICODIN) 5-325 MG tablet Take 1 tablet by mouth every 4 (four) hours as needed. (Patient not taking: Reported on 03/11/2017) 10 tablet 0 Not Taking at Unknown time  . metoCLOPramide (REGLAN) 10 MG tablet Take 1 tablet (10 mg total) by mouth every 8 (eight) hours as needed for nausea. 30 tablet 2 03/10/2017 at Unknown time  . Nutritional Supplements (ENSURE HEALTHY MOM) LIQD Take 1 Bottle by mouth 2 (two) times daily. (Patient not taking: Reported on 03/11/2017) 12 Bottle 0 Not Taking  at Unknown time  . omeprazole (PRILOSEC) 20 MG capsule TAKE ONE CAPSULE BY MOUTH TWICE A DAY BEFORE MEALS (Patient not taking: Reported on 03/11/2017) 60 capsule 2 Not Taking at Unknown time  . potassium chloride SA (K-DUR,KLOR-CON) 20 MEQ tablet Take 1 tablet (20 mEq total) by mouth 2 (two) times daily. 4 tablet 0     Review of Systems  Constitutional: Positive for appetite change. Negative for chills and fever.  Gastrointestinal: Positive for abdominal pain and nausea. Negative for constipation, diarrhea and vomiting.  Genitourinary: Negative.    Physical Exam   Blood pressure 105/71, pulse 100, temperature 97.8 F (36.6 C), resp. rate 18, height 5\' 1"  (1.549 m), weight 160 lb (72.6 kg), last menstrual period 07/19/2016, unknown if currently breastfeeding.  Physical Exam  Nursing note and vitals reviewed. Constitutional: She is oriented to person, place, and time. She appears well-developed and well-nourished. No distress.  HENT:  Head: Normocephalic and atraumatic.  Eyes: Conjunctivae are normal. Right eye exhibits no discharge. Left eye exhibits no discharge. No scleral icterus.  Neck: Normal range of motion.  Respiratory: Effort normal. No respiratory distress.  GI: Soft.  Ctx palpate mild w/adequate relaxation. No tenderness.   Neurological: She is alert and oriented to person, place, and time.  Skin: Skin is warm and dry. She is not diaphoretic.  Psychiatric: She has a  normal mood and affect. Her behavior is normal. Judgment and thought content normal.   Dilation: 1 Effacement (%): Thick Cervical Position: Posterior Station: -3 Exam by:: Judeth Horn, NP   Fetal Tracing:  Baseline: 135 Variability: moderate Accelerations: 15x15 Decelerations: none  Toco: none MAU Course  Procedures Results for orders placed or performed during the hospital encounter of 03/30/17 (from the past 24 hour(s))  Urinalysis, Routine w reflex microscopic     Status: Abnormal   Collection  Time: 03/30/17  8:30 PM  Result Value Ref Range   Color, Urine YELLOW YELLOW   APPearance HAZY (A) CLEAR   Specific Gravity, Urine 1.031 (H) 1.005 - 1.030   pH 6.0 5.0 - 8.0   Glucose, UA NEGATIVE NEGATIVE mg/dL   Hgb urine dipstick NEGATIVE NEGATIVE   Bilirubin Urine NEGATIVE NEGATIVE   Ketones, ur 5 (A) NEGATIVE mg/dL   Protein, ur 30 (A) NEGATIVE mg/dL   Nitrite NEGATIVE NEGATIVE   Leukocytes, UA TRACE (A) NEGATIVE   RBC / HPF 0-5 0 - 5 RBC/hpf   WBC, UA 0-5 0 - 5 WBC/hpf   Bacteria, UA RARE (A) NONE SEEN   Squamous Epithelial / LPF 0-5 (A) NONE SEEN   Mucous PRESENT     MDM Reactive NST Cervix unchanged from exam 3 weeks ago Zofran 8 mg odt After receiving zofran; patient dressed and requesting to be discharged home S/w Dr. Dareen Piano. Ok to discharge home Assessment and Plan  A: 1. Braxton Hicks contractions   2. Nausea and vomiting during pregnancy    P: Discharge home Rx zofran Discussed reasons to return to MAU Keep f/u with OB  Judeth Horn 03/30/2017, 8:12 PM

## 2017-03-30 NOTE — MAU Note (Addendum)
Abdominal pain for 3 days. Possibly contractions and makes it hard to walk. Belly gets tight when I walk. Denies LOF or bleeding. Have not felt like eating or drinking much in the last wk. Just have not felt like it

## 2017-03-30 NOTE — Discharge Instructions (Signed)

## 2017-04-10 LAB — OB RESULTS CONSOLE GBS: STREP GROUP B AG: NEGATIVE

## 2017-04-19 ENCOUNTER — Other Ambulatory Visit: Payer: Self-pay | Admitting: Obstetrics & Gynecology

## 2017-04-19 ENCOUNTER — Encounter (HOSPITAL_COMMUNITY): Payer: Self-pay | Admitting: *Deleted

## 2017-04-19 ENCOUNTER — Telehealth (HOSPITAL_COMMUNITY): Payer: Self-pay | Admitting: *Deleted

## 2017-04-19 NOTE — Telephone Encounter (Signed)
Preadmission screen  

## 2017-04-19 NOTE — Telephone Encounter (Signed)
Preadmission screen C/o increased pressure since yesterday.  History of delivery at home Precipitous delivery in MAU with last pregnancy.  Instructed pt to call MD office at end of conversation.  Spoke with Paulino RilyAshley RN and notified of pt complaints.

## 2017-04-22 ENCOUNTER — Encounter (HOSPITAL_COMMUNITY): Payer: Self-pay

## 2017-04-22 ENCOUNTER — Inpatient Hospital Stay (HOSPITAL_COMMUNITY)
Admission: AD | Admit: 2017-04-22 | Discharge: 2017-04-22 | Disposition: A | Discharge status: Home / Self Care | Payer: Medicaid Other | Source: Ambulatory Visit | Attending: Obstetrics and Gynecology | Admitting: Obstetrics and Gynecology

## 2017-04-22 DIAGNOSIS — Z3A38 38 weeks gestation of pregnancy: Secondary | ICD-10-CM | POA: Diagnosis not present

## 2017-04-22 DIAGNOSIS — O479 False labor, unspecified: Secondary | ICD-10-CM

## 2017-04-22 DIAGNOSIS — O471 False labor at or after 37 completed weeks of gestation: Secondary | ICD-10-CM | POA: Insufficient documentation

## 2017-04-22 LAB — URINALYSIS, ROUTINE W REFLEX MICROSCOPIC
BILIRUBIN URINE: NEGATIVE
GLUCOSE, UA: NEGATIVE mg/dL
HGB URINE DIPSTICK: NEGATIVE
KETONES UR: 5 mg/dL — AB
NITRITE: NEGATIVE
PH: 6 (ref 5.0–8.0)
Protein, ur: NEGATIVE mg/dL
Specific Gravity, Urine: 1.027 (ref 1.005–1.030)

## 2017-04-22 MED ORDER — HYDROMORPHONE HCL 1 MG/ML IJ SOLN: 1.0000 mg | Freq: Once | INTRAMUSCULAR | Status: DC

## 2017-04-22 MED ORDER — KETOROLAC TROMETHAMINE 30 MG/ML IJ SOLN: 30.0000 mg | Freq: Once | INTRAMUSCULAR | Status: DC

## 2017-04-22 NOTE — MAU Note (Signed)
Patient presents with abdominal pain x2 days, stating her stomach is real tight and can barely walk, denies vaginal bleding.

## 2017-04-22 NOTE — MAU Note (Signed)
I have communicated with Waynard ReedsKendra Ross and reviewed vital signs:  Vitals:   04/22/17 1644 04/22/17 1853  BP: (!) 105/58 113/68  Pulse: 97   Resp: 18   Temp: 97.8 F (36.6 C)     Vaginal exam:  Dilation: 2 Effacement (%): Thick Cervical Position: Middle Station: -3 Exam by:: n Nicholl Onstott rn,   Also reviewed contraction pattern and that non-stress test is reactive.  It has been documented that patient is contracting irregularly with no cervical change over 1.5 hours not indicating active labor.  Patient denies any other complaints.  Based on this report provider has given order for discharge.  A discharge order and diagnosis entered by a provider.   Labor discharge instructions reviewed with patient.

## 2017-04-22 NOTE — Discharge Instructions (Signed)

## 2017-04-27 ENCOUNTER — Inpatient Hospital Stay (HOSPITAL_COMMUNITY)
Admission: AD | Admit: 2017-04-27 | Discharge: 2017-04-29 | DRG: 775 | Disposition: A | Discharge status: Home / Self Care | Payer: Medicaid Other | Source: Ambulatory Visit | Attending: Obstetrics & Gynecology | Admitting: Obstetrics & Gynecology

## 2017-04-27 ENCOUNTER — Encounter (HOSPITAL_COMMUNITY): Payer: Self-pay

## 2017-04-27 ENCOUNTER — Inpatient Hospital Stay (HOSPITAL_COMMUNITY): Payer: Medicaid Other | Admitting: Anesthesiology

## 2017-04-27 DIAGNOSIS — O9902 Anemia complicating childbirth: Secondary | ICD-10-CM | POA: Diagnosis present

## 2017-04-27 DIAGNOSIS — Z6830 Body mass index (BMI) 30.0-30.9, adult: Secondary | ICD-10-CM

## 2017-04-27 DIAGNOSIS — Z3A38 38 weeks gestation of pregnancy: Secondary | ICD-10-CM

## 2017-04-27 DIAGNOSIS — Z87891 Personal history of nicotine dependence: Secondary | ICD-10-CM

## 2017-04-27 DIAGNOSIS — O99214 Obesity complicating childbirth: Secondary | ICD-10-CM | POA: Diagnosis present

## 2017-04-27 DIAGNOSIS — Z3A39 39 weeks gestation of pregnancy: Secondary | ICD-10-CM

## 2017-04-27 DIAGNOSIS — D649 Anemia, unspecified: Secondary | ICD-10-CM | POA: Diagnosis present

## 2017-04-27 DIAGNOSIS — E669 Obesity, unspecified: Secondary | ICD-10-CM | POA: Diagnosis present

## 2017-04-27 DIAGNOSIS — Z3493 Encounter for supervision of normal pregnancy, unspecified, third trimester: Secondary | ICD-10-CM | POA: Diagnosis present

## 2017-04-27 LAB — CBC
HCT: 34 % — ABNORMAL LOW (ref 36.0–46.0)
HEMOGLOBIN: 11.8 g/dL — AB (ref 12.0–15.0)
MCH: 30.6 pg (ref 26.0–34.0)
MCHC: 34.7 g/dL (ref 30.0–36.0)
MCV: 88.3 fL (ref 78.0–100.0)
PLATELETS: 246 10*3/uL (ref 150–400)
RBC: 3.85 MIL/uL — ABNORMAL LOW (ref 3.87–5.11)
RDW: 13.5 % (ref 11.5–15.5)
WBC: 8.1 10*3/uL (ref 4.0–10.5)

## 2017-04-27 LAB — ABO/RH: ABO/RH(D): O POS

## 2017-04-27 LAB — TYPE AND SCREEN
ABO/RH(D): O POS
Antibody Screen: NEGATIVE

## 2017-04-27 LAB — RPR: RPR: NONREACTIVE

## 2017-04-27 MED ORDER — DIPHENHYDRAMINE HCL 25 MG PO CAPS
25.0000 mg | ORAL_CAPSULE | Freq: Four times a day (QID) | ORAL | Status: DC | PRN
  Administered 2017-04-27 – 2017-04-29 (×2): 25 mg via ORAL
  Filled 2017-04-27 (×2): qty 1

## 2017-04-27 MED ORDER — LACTATED RINGERS IV SOLN: 500.0000 mL | Freq: Once | INTRAVENOUS | Status: DC

## 2017-04-27 MED ORDER — ONDANSETRON HCL 4 MG/2ML IJ SOLN: 4.0000 mg | INTRAMUSCULAR | Status: DC | PRN

## 2017-04-27 MED ORDER — PHENYLEPHRINE 40 MCG/ML (10ML) SYRINGE FOR IV PUSH (FOR BLOOD PRESSURE SUPPORT)
80.0000 ug | PREFILLED_SYRINGE | INTRAVENOUS | Status: DC | PRN
  Administered 2017-04-27: 40 ug via INTRAVENOUS
  Filled 2017-04-27: qty 5
  Filled 2017-04-27: qty 10

## 2017-04-27 MED ORDER — ONDANSETRON HCL 4 MG/2ML IJ SOLN
4.0000 mg | Freq: Four times a day (QID) | INTRAMUSCULAR | Status: DC | PRN
  Administered 2017-04-27: 4 mg via INTRAVENOUS
  Filled 2017-04-27: qty 2

## 2017-04-27 MED ORDER — ZOLPIDEM TARTRATE 5 MG PO TABS: 5.0000 mg | ORAL_TABLET | Freq: Every evening | ORAL | Status: DC | PRN

## 2017-04-27 MED ORDER — PRENATAL MULTIVITAMIN CH
1.0000 | ORAL_TABLET | Freq: Every day | ORAL | Status: DC
  Administered 2017-04-28 – 2017-04-29 (×2): 1 via ORAL
  Filled 2017-04-27 (×2): qty 1

## 2017-04-27 MED ORDER — DIPHENHYDRAMINE HCL 50 MG/ML IJ SOLN: 12.5000 mg | INTRAMUSCULAR | Status: DC | PRN

## 2017-04-27 MED ORDER — ACETAMINOPHEN 325 MG PO TABS: 650.0000 mg | ORAL_TABLET | ORAL | Status: DC | PRN

## 2017-04-27 MED ORDER — TETANUS-DIPHTH-ACELL PERTUSSIS 5-2.5-18.5 LF-MCG/0.5 IM SUSP: 0.5000 mL | Freq: Once | INTRAMUSCULAR | Status: DC

## 2017-04-27 MED ORDER — EPHEDRINE 5 MG/ML INJ
10.0000 mg | INTRAVENOUS | Status: DC | PRN
Start: 2017-04-27 — End: 2017-04-29
  Filled 2017-04-27: qty 2

## 2017-04-27 MED ORDER — OXYTOCIN 40 UNITS IN LACTATED RINGERS INFUSION - SIMPLE MED
1.0000 m[IU]/min | INTRAVENOUS | Status: DC
  Administered 2017-04-27: 6 m[IU]/min via INTRAVENOUS
  Administered 2017-04-27: 2 m[IU]/min via INTRAVENOUS
  Administered 2017-04-27: 6 m[IU]/min via INTRAVENOUS
  Filled 2017-04-27: qty 1000

## 2017-04-27 MED ORDER — FENTANYL CITRATE (PF) 100 MCG/2ML IJ SOLN
50.0000 ug | INTRAMUSCULAR | Status: DC | PRN
  Administered 2017-04-27: 50 ug via INTRAVENOUS
  Filled 2017-04-27: qty 2

## 2017-04-27 MED ORDER — LACTATED RINGERS IV SOLN
INTRAVENOUS | Status: DC
  Administered 2017-04-27 (×2): 1000 mL via INTRAVENOUS

## 2017-04-27 MED ORDER — LIDOCAINE HCL (PF) 1 % IJ SOLN
INTRAMUSCULAR | Status: DC | PRN
  Administered 2017-04-27: 4 mL via EPIDURAL
  Administered 2017-04-27: 3 mL via EPIDURAL

## 2017-04-27 MED ORDER — LIDOCAINE HCL (PF) 1 % IJ SOLN
30.0000 mL | INTRAMUSCULAR | Status: DC | PRN
  Filled 2017-04-27: qty 30

## 2017-04-27 MED ORDER — PHENYLEPHRINE 40 MCG/ML (10ML) SYRINGE FOR IV PUSH (FOR BLOOD PRESSURE SUPPORT)
80.0000 ug | PREFILLED_SYRINGE | INTRAVENOUS | Status: DC | PRN
  Administered 2017-04-27: 40 ug via INTRAVENOUS
  Filled 2017-04-27: qty 5

## 2017-04-27 MED ORDER — NALBUPHINE HCL 10 MG/ML IJ SOLN
2.5000 mg | Freq: Once | INTRAMUSCULAR | Status: AC
  Administered 2017-04-27: 2.5 mg via INTRAVENOUS
  Filled 2017-04-27: qty 1

## 2017-04-27 MED ORDER — OXYTOCIN 40 UNITS IN LACTATED RINGERS INFUSION - SIMPLE MED: 2.5000 [IU]/h | INTRAVENOUS | Status: DC

## 2017-04-27 MED ORDER — BENZOCAINE-MENTHOL 20-0.5 % EX AERO: 1.0000 "application " | INHALATION_SPRAY | CUTANEOUS | Status: DC | PRN

## 2017-04-27 MED ORDER — EPHEDRINE 5 MG/ML INJ
10.0000 mg | INTRAVENOUS | Status: DC | PRN
  Filled 2017-04-27: qty 2

## 2017-04-27 MED ORDER — SENNOSIDES-DOCUSATE SODIUM 8.6-50 MG PO TABS
2.0000 | ORAL_TABLET | ORAL | Status: DC
  Administered 2017-04-29: 2 via ORAL
  Filled 2017-04-27 (×2): qty 2

## 2017-04-27 MED ORDER — PENICILLIN G POTASSIUM 5000000 UNITS IJ SOLR
5.0000 10*6.[IU] | Freq: Once | INTRAVENOUS | Status: DC
  Filled 2017-04-27: qty 5

## 2017-04-27 MED ORDER — OXYTOCIN BOLUS FROM INFUSION
500.0000 mL | Freq: Once | INTRAVENOUS | Status: AC
  Administered 2017-04-27: 500 mL via INTRAVENOUS

## 2017-04-27 MED ORDER — TERBUTALINE SULFATE 1 MG/ML IJ SOLN
0.2500 mg | Freq: Once | INTRAMUSCULAR | Status: DC | PRN
  Filled 2017-04-27: qty 1

## 2017-04-27 MED ORDER — WITCH HAZEL-GLYCERIN EX PADS: 1.0000 "application " | MEDICATED_PAD | CUTANEOUS | Status: DC | PRN

## 2017-04-27 MED ORDER — FENTANYL 2.5 MCG/ML BUPIVACAINE 1/10 % EPIDURAL INFUSION (WH - ANES)
14.0000 mL/h | INTRAMUSCULAR | Status: DC | PRN
  Administered 2017-04-27: 11.5 mL/h via EPIDURAL
  Filled 2017-04-27: qty 100

## 2017-04-27 MED ORDER — ONDANSETRON HCL 4 MG PO TABS: 4.0000 mg | ORAL_TABLET | ORAL | Status: DC | PRN

## 2017-04-27 MED ORDER — OXYCODONE-ACETAMINOPHEN 5-325 MG PO TABS
1.0000 | ORAL_TABLET | ORAL | Status: DC | PRN
  Administered 2017-04-27: 1 via ORAL

## 2017-04-27 MED ORDER — PENICILLIN G POT IN DEXTROSE 60000 UNIT/ML IV SOLN
3.0000 10*6.[IU] | INTRAVENOUS | Status: DC
  Filled 2017-04-27 (×2): qty 50

## 2017-04-27 MED ORDER — NALBUPHINE HCL 10 MG/ML IJ SOLN: 5.0000 mg | Freq: Once | INTRAMUSCULAR | Status: DC

## 2017-04-27 MED ORDER — COCONUT OIL OIL: 1.0000 "application " | TOPICAL_OIL | Status: DC | PRN

## 2017-04-27 MED ORDER — SOD CITRATE-CITRIC ACID 500-334 MG/5ML PO SOLN
30.0000 mL | ORAL | Status: DC | PRN
  Administered 2017-04-27: 30 mL via ORAL
  Filled 2017-04-27: qty 15

## 2017-04-27 MED ORDER — SIMETHICONE 80 MG PO CHEW: 80.0000 mg | CHEWABLE_TABLET | ORAL | Status: DC | PRN

## 2017-04-27 MED ORDER — OXYCODONE-ACETAMINOPHEN 5-325 MG PO TABS
2.0000 | ORAL_TABLET | ORAL | Status: DC | PRN
  Administered 2017-04-28 – 2017-04-29 (×6): 2 via ORAL
  Filled 2017-04-27 (×8): qty 2

## 2017-04-27 MED ORDER — IBUPROFEN 600 MG PO TABS
600.0000 mg | ORAL_TABLET | Freq: Four times a day (QID) | ORAL | Status: DC
  Administered 2017-04-27 – 2017-04-29 (×7): 600 mg via ORAL
  Filled 2017-04-27 (×8): qty 1

## 2017-04-27 MED ORDER — DIBUCAINE 1 % RE OINT: 1.0000 "application " | TOPICAL_OINTMENT | RECTAL | Status: DC | PRN

## 2017-04-27 MED ORDER — LACTATED RINGERS IV SOLN: 500.0000 mL | INTRAVENOUS | Status: DC | PRN

## 2017-04-27 MED ORDER — MISOPROSTOL 25 MCG QUARTER TABLET: 25.0000 ug | ORAL_TABLET | ORAL | Status: DC | PRN

## 2017-04-27 NOTE — Anesthesia Preprocedure Evaluation (Signed)
Anesthesia Evaluation  Patient identified by MRN, date of birth, ID band Patient awake    Reviewed: Allergy & Precautions, H&P , Patient's Chart, lab work & pertinent test results  Airway Mallampati: II  TM Distance: >3 FB Neck ROM: full    Dental no notable dental hx. (+) Teeth Intact   Pulmonary former smoker,    Pulmonary exam normal breath sounds clear to auscultation       Cardiovascular negative cardio ROS Normal cardiovascular exam Rhythm:regular Rate:Normal     Neuro/Psych PSYCHIATRIC DISORDERS Anxiety Depression negative neurological ROS  negative psych ROS   GI/Hepatic negative GI ROS, Neg liver ROS, GERD  Medicated and Controlled,  Endo/Other  Obesity  Renal/GU negative Renal ROS  negative genitourinary   Musculoskeletal   Abdominal (+) + obese,   Peds  Hematology  (+) anemia ,   Anesthesia Other Findings   Reproductive/Obstetrics (+) Pregnancy                             Anesthesia Physical Anesthesia Plan  ASA: II  Anesthesia Plan: Epidural   Post-op Pain Management:    Induction:   PONV Risk Score and Plan: 2 and Ondansetron, Dexamethasone and Treatment may vary due to age or medical condition  Airway Management Planned: Natural Airway  Additional Equipment:   Intra-op Plan:   Post-operative Plan:   Informed Consent: I have reviewed the patients History and Physical, chart, labs and discussed the procedure including the risks, benefits and alternatives for the proposed anesthesia with the patient or authorized representative who has indicated his/her understanding and acceptance.     Plan Discussed with: Anesthesiologist  Anesthesia Plan Comments:         Anesthesia Quick Evaluation

## 2017-04-27 NOTE — Progress Notes (Signed)
Kelsey Cooper is a 26 y.o. [id number] at 3755w1d by LMP admitted for early active labor  Subjective: Patient comfortable with epidural  Objective: BP (!) 95/44   Pulse 70   Temp (!) 97.5 F (36.4 C) (Oral)   Resp 16   Ht 5\' 1"  (1.549 m)   Wt 74.4 kg (164 lb)   LMP 07/19/2016 (Within Weeks)   SpO2 100%   BMI 30.99 kg/m  No intake/output data recorded. No intake/output data recorded.  FHT:  FHR: 135 bpm, variability: moderate,  accelerations:  Present,  decelerations:  Absent UC:   regular, every 2-3 minutes SVE:   Dilation: 5.5 Effacement (%): 90 Station: -2 Exam by:: Solangel Mcmanaway  Labs: Lab Results  Component Value Date   WBC 8.1 04/27/2017   HGB 11.8 (L) 04/27/2017   HCT 34.0 (L) 04/27/2017   MCV 88.3 04/27/2017   PLT 246 04/27/2017    Assessment / Plan: Augmentation of labor, progressing well  Labor: Progressing on Pitocin,  AROM done Preeclampsia:  no signs or symptoms of toxicity Fetal Wellbeing:  Category I Pain Control:  Epidural I/D:  n/a Anticipated MOD:  NSVD  [place] Neuro[facility] STACIA 04/27/2017, 3:28 PM

## 2017-04-27 NOTE — H&P (Signed)
26 y.o. 2064w1d  [id number] comes in c/o contractions.  Pt was so uncomfortable at first that she was thought to be 9 but her cervix has only changed to 4 from 3.  Otherwise has good fetal movement and no bleeding.  Past Medical History:  Diagnosis Date  . Anxiety   . GERD (gastroesophageal reflux disease)   . Severe depression (HCC)     Past Surgical History:  Procedure Laterality Date  . NO PAST SURGERIES      OB History  Gravida Para Term Preterm AB Living  5 2 2  0 2 2  SAB TAB Ectopic Multiple Live Births  1 1     2     # Outcome Date GA Lbr Len/2nd Weight Sex Delivery Anes PTL Lv  5 Current           4 Term 06/11/11 7241w0d    Vag-Spont   LIV  3 Term 01/27/08 3286w0d  6 lb 11 oz (3.033 kg)  Vag-Spont   LIV  2 SAB           1 TAB               Social History   Social History  . Marital status: Single    Spouse name: N/A  . Number of children: N/A  . Years of education: N/A   Occupational History  . Not on file.   Social History Main Topics  . Smoking status: Former Smoker    Packs/day: 1.00    Types: Cigarettes  . Smokeless tobacco: Former NeurosurgeonUser  . Alcohol use No  . Drug use: No  . Sexual activity: Not Currently    Birth control/ protection: None   Other Topics Concern  . Not on file   Social History Narrative  . No narrative on file   Patient has no known allergies.    Prenatal Transfer Tool  Maternal Diabetes: No Genetic Screening: Normal Maternal Ultrasounds/Referrals: Normal Fetal Ultrasounds or other Referrals:  None Maternal Substance Abuse:  No Significant Maternal Medications:  None Significant Maternal Lab Results: None  Other PNC: uncomplicated.    Vitals:   04/27/17 0515  BP: 115/74  Pulse: 91  Resp: 18  Temp: 97.9 F (36.6 C)     Lungs/Cor:  NAD Abdomen:  soft, gravid Ex:  no cords, erythema SVE:  4/thick/-2 per CNM FHTs:  130s, good STV, NST R Toco:  q 3-5   A/P   Term, probable labor but will obs for now.  GBS  neg.  Khristina Janota A

## 2017-04-27 NOTE — Anesthesia Procedure Notes (Signed)
Epidural Patient location during procedure: OB Start time: 04/27/2017 10:30 AM  Staffing Anesthesiologist: Mal AmabileFOSTER, Jenita Rayfield  Preanesthetic Checklist Completed: patient identified, site marked, surgical consent, pre-op evaluation, timeout performed, IV checked, risks and benefits discussed and monitors and equipment checked  Epidural Patient position: sitting Prep: site prepped and draped and DuraPrep Patient monitoring: continuous pulse ox and blood pressure Approach: midline Location: L3-L4 Injection technique: LOR air  Needle:  Needle type: Tuohy  Needle gauge: 17 G Needle length: 9 cm and 9 Needle insertion depth: 5 cm cm Catheter type: closed end flexible Catheter size: 19 Gauge Catheter at skin depth: 10 cm Test dose: negative and Other  Assessment Events: blood not aspirated, injection not painful, no injection resistance, negative IV test and no paresthesia  Additional Notes Patient identified. Risks and benefits discussed including failed block, incomplete  Pain control, post dural puncture headache, nerve damage, paralysis, blood pressure Changes, nausea, vomiting, reactions to medications-both toxic and allergic and post Partum back pain. All questions were answered. Patient expressed understanding and wished to proceed. Sterile technique was used throughout procedure. Epidural site was Dressed with sterile barrier dressing. No paresthesias, signs of intravascular injection Or signs of intrathecal spread were encountered.  Patient was more comfortable after the epidural was dosed. Please see RN's note for documentation of vital signs and FHR which are stable.

## 2017-04-27 NOTE — MAU Note (Signed)
Pt states that contractions started today around 6 pm. No vaginal bleeding or discharge. + FM. No gush of fluid.

## 2017-04-27 NOTE — Anesthesia Pain Management Evaluation Note (Signed)
  [facility] Pain Management Visit Note  Patient: Kelsey Cooper, 26 y.o., female  "Hello I am a member of the anesthesia team at [facility]. We have an anesthesia team available at all times to provide care throughout the hospital, including epidural management and anesthesia for C-section. I don't know your plan for the delivery whether it a natural birth, water birth, IV sedation, nitrous supplementation, doula or epidural, but we want to meet your pain goals."   1.Was your pain managed to your expectations on prior hospitalizations?   Yes   2.What is your expectation for pain management during this hospitalization?     IV pain meds and Nitrous Oxide  3.How can we help you reach that goal? Iv pain meds  Record the patient's initial score and the patient's pain goal.   Pain: 7  Pain Goal: 7 [facility] wants you to be able to say your pain was always managed very well.  Rica RecordsICKELTON,Kelsey Cooper 04/27/2017

## 2017-04-27 NOTE — Progress Notes (Signed)
Pulled back sheets to check pt's cervix, baby's head was crowning proceeding rapidly to birth.

## 2017-04-28 ENCOUNTER — Encounter (HOSPITAL_COMMUNITY)
Admission: AD | Disposition: A | Discharge status: Home / Self Care | Payer: Self-pay | Source: Ambulatory Visit | Attending: Obstetrics & Gynecology

## 2017-04-28 ENCOUNTER — Inpatient Hospital Stay (HOSPITAL_COMMUNITY): Payer: Medicaid Other | Admitting: Anesthesiology

## 2017-04-28 LAB — CBC
HCT: 30.2 % — ABNORMAL LOW (ref 36.0–46.0)
Hemoglobin: 10.6 g/dL — ABNORMAL LOW (ref 12.0–15.0)
MCH: 31.4 pg (ref 26.0–34.0)
MCHC: 35.1 g/dL (ref 30.0–36.0)
MCV: 89.3 fL (ref 78.0–100.0)
PLATELETS: 205 10*3/uL (ref 150–400)
RBC: 3.38 MIL/uL — AB (ref 3.87–5.11)
RDW: 13.5 % (ref 11.5–15.5)
WBC: 7 10*3/uL (ref 4.0–10.5)

## 2017-04-28 SURGERY — LIGATION, FALLOPIAN TUBE, POSTPARTUM
Anesthesia: Epidural

## 2017-04-28 MED ORDER — FAMOTIDINE 20 MG PO TABS: 40.0000 mg | ORAL_TABLET | Freq: Once | ORAL | Status: DC

## 2017-04-28 MED ORDER — METOCLOPRAMIDE HCL 10 MG PO TABS: 10.0000 mg | ORAL_TABLET | Freq: Once | ORAL | Status: DC

## 2017-04-28 MED ORDER — METOCLOPRAMIDE HCL 10 MG PO TABS
10.0000 mg | ORAL_TABLET | Freq: Once | ORAL | Status: AC
  Administered 2017-04-28: 10 mg via ORAL
  Filled 2017-04-28: qty 1

## 2017-04-28 MED ORDER — LACTATED RINGERS IV SOLN: INTRAVENOUS | Status: DC

## 2017-04-28 MED ORDER — FAMOTIDINE 20 MG PO TABS
40.0000 mg | ORAL_TABLET | Freq: Once | ORAL | Status: AC
  Administered 2017-04-28: 40 mg via ORAL
  Filled 2017-04-28: qty 2

## 2017-04-28 NOTE — Progress Notes (Signed)
Patient is doing well.  She is ambulating, tolerating PO.  Pain control is good.  Lochia is appropriate She had some urinary retention overnight--MD was not notified.  Per nursing notes: was unable to void 6h s/p delivery.  I&O cath for 1000mL urine.  At 0345, unable to void (bladder scan showed 300-400 cc urine).  At 550, voided but still 300 mL PVR.   Patient is also scheduled for a PP BTL this AM.  She had multiple questions, most focusing on tubal reversible.  She was under the impression that a tubal ligation was easily reversible in the future.   Vitals:   04/27/17 2000 04/27/17 2100 04/28/17 0100 04/28/17 0630  BP: 120/65 111/67 (!) 101/55 (!) 107/92  Pulse: 69 84 60 (!) 58  Resp: 18 18 20 18   Temp: 98.2 F (36.8 C) 98 F (36.7 C) 98.2 F (36.8 C) 97.8 F (36.6 C)  TempSrc: Axillary Oral Oral Oral  SpO2:      Weight:      Height:        NAD Fundus firm Ext: no edema  Lab Results  Component Value Date   WBC 7.0 04/28/2017   HGB 10.6 (L) 04/28/2017   HCT 30.2 (L) 04/28/2017   MCV 89.3 04/28/2017   PLT 205 04/28/2017    --/--/O POS (08/10 0835)/RImmune  A/P 25 y.o. [id number] PPD#1. Routine care.   PP BTL cancelled--discussed alternative options.  Is most interested in IUD in PP period.  Can consider bridge w depo provera (bottle feeding).  Possible urinary retention.  Void now w bladder scan after.  If significant PVR or unable to void now, will place foley catheter.  Cont strict I&Os today Expect d/c tomorrow.    [facility]ANNA GEFFEL [employer]

## 2017-04-28 NOTE — Plan of Care (Addendum)
Problem: Urinary Elimination: Goal: Ability to reestablish a normal urinary elimination pattern will improve Outcome: Progressing Pt attempted to void at 2300, unable to do so. Bladder scan indicated 999+ ml of urine in bladder, pt was straight catheterized and had 1,000 ml output.  Pt attempted to void at 0345, unable to do so. Bladder scan indicated between 300 and 400 ml of urine in bladder, pt agreed to try to void again, will straight catheterize again if unsuccessful  0550- pt voided, bladder scan showed 250-345 ml urine and bladder, encouraged to attempt to void again within the hour

## 2017-04-28 NOTE — Anesthesia Postprocedure Evaluation (Signed)
Anesthesia Post Note  Patient: Kelsey Cooper  Procedure(s) Performed: * No procedures listed *     Patient location during evaluation: Mother Baby Anesthesia Type: Epidural Level of consciousness: awake and alert, oriented and patient cooperative Pain management: pain level controlled Vital Signs Assessment: post-procedure vital signs reviewed and stable Respiratory status: spontaneous breathing Cardiovascular status: stable Postop Assessment: no headache, epidural receding, patient able to bend at knees and no signs of nausea or vomiting Anesthetic complications: no Comments: Pain score 8.  Bedside RN present and had just administered pain meds to pain immediately prior to assessment.    Last Vitals:  Vitals:   04/28/17 0100 04/28/17 0630  BP: (!) 101/55 (!) 107/92  Pulse: 60 (!) 58  Resp: 20 18  Temp: 36.8 C 36.6 C  SpO2:      Last Pain:  Vitals:   04/28/17 1711  TempSrc:   PainSc: 6    Pain Goal:                 [facility] LtdWRINKLE,Evo Aderman

## 2017-04-28 NOTE — Anesthesia Preprocedure Evaluation (Deleted)
Anesthesia Evaluation  Patient identified by MRN, date of birth, ID band Patient awake    Reviewed: Allergy & Precautions, H&P , NPO status , Patient's Chart, lab work & pertinent test results  Airway Mallampati: II  TM Distance: >3 FB Neck ROM: full    Dental no notable dental hx. (+) Teeth Intact   Pulmonary former smoker,    Pulmonary exam normal breath sounds clear to auscultation       Cardiovascular negative cardio ROS Normal cardiovascular exam Rhythm:regular Rate:Normal     Neuro/Psych PSYCHIATRIC DISORDERS Anxiety Depression negative neurological ROS  negative psych ROS   GI/Hepatic negative GI ROS, Neg liver ROS, GERD  Medicated and Controlled,  Endo/Other  Obesity  Renal/GU negative Renal ROS  negative genitourinary   Musculoskeletal   Abdominal (+) + obese,   Peds  Hematology  (+) anemia ,   Anesthesia Other Findings   Reproductive/Obstetrics Desires sterilization                             Anesthesia Physical  Anesthesia Plan  ASA: II  Anesthesia Plan: Epidural   Post-op Pain Management:    Induction:   PONV Risk Score and Plan: 2 and Ondansetron, Dexamethasone and Treatment may vary due to age or medical condition  Airway Management Planned: Natural Airway  Additional Equipment:   Intra-op Plan:   Post-operative Plan:   Informed Consent: I have reviewed the patients History and Physical, chart, labs and discussed the procedure including the risks, benefits and alternatives for the proposed anesthesia with the patient or authorized representative who has indicated his/her understanding and acceptance.   Dental advisory given  Plan Discussed with: Anesthesiologist, CRNA and Surgeon  Anesthesia Plan Comments:         Anesthesia Quick Evaluation

## 2017-04-28 NOTE — Progress Notes (Signed)
MOB was referred for history of depression/anxiety.  Referral is screened out by Clinical Social Worker because none of the following criteria appear to apply and there are no reports impacting the pregnancy or her transition to the postpartum period.  CSW does not deem it clinically necessary to further investigate at this time.   -History of anxiety/depression during this pregnancy, or of post-partum depression. - Diagnosis of anxiety and/or depression within last 3 years.-  - History of depression due to pregnancy loss/loss of child or  -MOB's symptoms are currently being treated with medication and/or therapy.  CS[facility] chart review [facility] hx of anxiety/ depression noted during this pregnancy and no hx of it in the past. Please contact the Clinical Social Worker if needs arise or upon MOB request.    Kelsey Cooper, MSW, LCSW-A Clinical Social Worker  [facility]  Office: [phone]

## 2017-04-28 NOTE — Progress Notes (Signed)
Pt voided .  PVR w bladder scan .    Pt refused foley and immediately voided a second time an additional .  Repeat PVR 47mL.    Discussed w nurse.  Will hold off on foley catheter at this time.  Will make sure that patient has timed voids q3-4hr today.  She will alert the nurse when she needs to void for an immediate PVR to assess for need for foley catheter placement.

## 2017-04-29 MED ORDER — OXYCODONE HCL 5 MG PO CAPS: 5.0000 mg | ORAL_CAPSULE | Freq: Four times a day (QID) | ORAL | 0 refills | Status: DC | PRN

## 2017-04-29 MED ORDER — ACETAMINOPHEN 325 MG PO TABS: 650.0000 mg | ORAL_TABLET | Freq: Four times a day (QID) | ORAL | 0 refills | Status: DC | PRN

## 2017-04-29 MED ORDER — IBUPROFEN 600 MG PO TABS: 600.0000 mg | ORAL_TABLET | Freq: Four times a day (QID) | ORAL | 0 refills | Status: DC

## 2017-04-29 NOTE — Discharge Summary (Signed)
Obstetric Discharge Summary Reason for Admission: onset of labor Prenatal Procedures: none Intrapartum Procedures: spontaneous vaginal delivery Postpartum Procedures: none Complications-Operative and Postpartum: none Hemoglobin  Date Value Ref Range Status  04/28/2017 10.6 (L) 12.0 - 15.0 g/dL Final   HCT  Date Value Ref Range Status  04/28/2017 30.2 (L) 36.0 - 46.0 % Final    Physical Exam:  General: alert, cooperative and appears stated age 43Lochia: appropriate Uterine Fundus: firm DVT Evaluation: No evidence of DVT seen on physical exam.  Discharge Diagnoses: Term Pregnancy-delivered  Discharge Information: Date: 04/29/2017 Activity: pelvic rest Diet: routine Medications: PNV, Ibuprofen and oxycodone Condition: stable Instructions: refer to practice specific booklet Discharge to: home Follow-up Information    Essie HartPinn, Walda, MD Follow up in 4 week(s).   Specialty:  Obstetrics and Gynecology Contact information: [address] [place] [address] [phone]           Newborn Data: Live born female  Birth Weight: 6 lb 6.7 oz (2912 g) APGAR: 8, 9  Home with mother.  Kelsey Cooper Kelsey Cooper 04/29/2017, 9:24 AM

## 2017-04-29 NOTE — Progress Notes (Signed)
Patient is doing well.  She is ambulating, voiding, tolerating PO.  Reports continued cramping.  Lochia is appropriate    Voided multiple times yesterday--appropriate volumes w PVRs < 50mL.  Pt feels that she is emptying her bladder well and has no concerns.   Vitals:   04/28/17 0100 04/28/17 0630 04/28/17 1900 04/29/17 0544  BP: (!) 101/55 (!) 107/92 113/73 113/69  Pulse: 60 (!) 58 (!) 58 (!) 58  Resp: 20 18 20 18   Temp: 98.2 F (36.8 C) 97.8 F (36.6 C) 98.2 F (36.8 C) 98.1 F (36.7 C)  TempSrc: Oral Oral Oral Oral  SpO2:      Weight:      Height:        NAD Fundus firm Ext: no  Lab Results  Component Value Date   WBC 7.0 04/28/2017   HGB 10.6 (L) 04/28/2017   HCT 30.2 (L) 04/28/2017   MCV 89.3 04/28/2017   PLT 205 04/28/2017    --/--/O POS (08/10 0835)/RImmune  A/P 25 y.o. [id number] PPD#2. Routine care.   Meeting all goals No s/sx of urinary retention today--warning signs reviewed Discharge to home today Declines depo provera as a bridge to iud.    [facility] GE[facility] [employer]

## 2017-05-03 ENCOUNTER — Inpatient Hospital Stay (HOSPITAL_COMMUNITY): Admission: RE | Admit: 2017-05-03 | Payer: Medicaid Other | Source: Ambulatory Visit

## 2017-05-16 NOTE — Progress Notes (Signed)
Post discharge chart review completed.  

## 2017-08-29 ENCOUNTER — Emergency Department (HOSPITAL_BASED_OUTPATIENT_CLINIC_OR_DEPARTMENT_OTHER): Payer: Medicaid Other

## 2017-08-29 ENCOUNTER — Encounter (HOSPITAL_BASED_OUTPATIENT_CLINIC_OR_DEPARTMENT_OTHER): Payer: Self-pay

## 2017-08-29 ENCOUNTER — Emergency Department (HOSPITAL_BASED_OUTPATIENT_CLINIC_OR_DEPARTMENT_OTHER)
Admission: EM | Admit: 2017-08-29 | Discharge: 2017-08-30 | Disposition: A | Discharge status: Home / Self Care | Payer: Medicaid Other | Attending: Emergency Medicine | Admitting: Emergency Medicine

## 2017-08-29 ENCOUNTER — Other Ambulatory Visit: Payer: Self-pay

## 2017-08-29 DIAGNOSIS — S3992XA Unspecified injury of lower back, initial encounter: Secondary | ICD-10-CM | POA: Diagnosis present

## 2017-08-29 DIAGNOSIS — K529 Noninfective gastroenteritis and colitis, unspecified: Secondary | ICD-10-CM | POA: Diagnosis not present

## 2017-08-29 DIAGNOSIS — Z79899 Other long term (current) drug therapy: Secondary | ICD-10-CM | POA: Insufficient documentation

## 2017-08-29 DIAGNOSIS — S39012A Strain of muscle, fascia and tendon of lower back, initial encounter: Secondary | ICD-10-CM | POA: Diagnosis not present

## 2017-08-29 DIAGNOSIS — F17210 Nicotine dependence, cigarettes, uncomplicated: Secondary | ICD-10-CM | POA: Insufficient documentation

## 2017-08-29 DIAGNOSIS — Y929 Unspecified place or not applicable: Secondary | ICD-10-CM | POA: Insufficient documentation

## 2017-08-29 DIAGNOSIS — Y998 Other external cause status: Secondary | ICD-10-CM | POA: Insufficient documentation

## 2017-08-29 DIAGNOSIS — A084 Viral intestinal infection, unspecified: Secondary | ICD-10-CM

## 2017-08-29 DIAGNOSIS — Y939 Activity, unspecified: Secondary | ICD-10-CM | POA: Diagnosis not present

## 2017-08-29 LAB — URINALYSIS, MICROSCOPIC (REFLEX)

## 2017-08-29 LAB — URINALYSIS, ROUTINE W REFLEX MICROSCOPIC
Bilirubin Urine: NEGATIVE
Glucose, UA: NEGATIVE mg/dL
Ketones, ur: NEGATIVE mg/dL
LEUKOCYTES UA: NEGATIVE
Nitrite: NEGATIVE
PROTEIN: NEGATIVE mg/dL
SPECIFIC GRAVITY, URINE: 1.025 (ref 1.005–1.030)
pH: 7 (ref 5.0–8.0)

## 2017-08-29 LAB — PREGNANCY, URINE: Preg Test, Ur: NEGATIVE

## 2017-08-29 MED ORDER — ONDANSETRON 8 MG PO TBDP
8.0000 mg | ORAL_TABLET | Freq: Once | ORAL | Status: AC
  Administered 2017-08-29: 8 mg via ORAL
  Filled 2017-08-29: qty 1

## 2017-08-29 MED ORDER — NAPROXEN 250 MG PO TABS
500.0000 mg | ORAL_TABLET | Freq: Once | ORAL | Status: AC
  Administered 2017-08-29: 500 mg via ORAL
  Filled 2017-08-29: qty 2
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
## 2017-08-30 MED ORDER — CYCLOBENZAPRINE HCL 10 MG PO TABS: 10.0000 mg | ORAL_TABLET | Freq: Three times a day (TID) | ORAL | 0 refills | Status: DC | PRN

## 2017-08-30 MED ORDER — ONDANSETRON 8 MG PO TBDP: 8.0000 mg | ORAL_TABLET | Freq: Three times a day (TID) | ORAL | 0 refills | Status: DC | PRN

## 2017-08-30 MED ORDER — NAPROXEN 375 MG PO TABS: 375.0000 mg | ORAL_TABLET | Freq: Two times a day (BID) | ORAL | 0 refills | Status: DC | PRN

## 2018-05-11 ENCOUNTER — Encounter (HOSPITAL_COMMUNITY): Payer: Self-pay

## 2018-05-11 ENCOUNTER — Other Ambulatory Visit: Payer: Self-pay

## 2018-05-11 ENCOUNTER — Ambulatory Visit (HOSPITAL_COMMUNITY)
Admission: EM | Admit: 2018-05-11 | Discharge: 2018-05-11 | Disposition: A | Discharge status: Home / Self Care | Payer: Medicaid Other | Attending: Internal Medicine | Admitting: Internal Medicine

## 2018-05-11 DIAGNOSIS — F419 Anxiety disorder, unspecified: Secondary | ICD-10-CM | POA: Insufficient documentation

## 2018-05-11 DIAGNOSIS — N898 Other specified noninflammatory disorders of vagina: Secondary | ICD-10-CM | POA: Diagnosis not present

## 2018-05-11 DIAGNOSIS — T148XXA Other injury of unspecified body region, initial encounter: Secondary | ICD-10-CM

## 2018-05-11 DIAGNOSIS — S60421A Blister (nonthermal) of left index finger, initial encounter: Secondary | ICD-10-CM | POA: Diagnosis present

## 2018-05-11 DIAGNOSIS — Z79899 Other long term (current) drug therapy: Secondary | ICD-10-CM | POA: Insufficient documentation

## 2018-05-11 DIAGNOSIS — K219 Gastro-esophageal reflux disease without esophagitis: Secondary | ICD-10-CM | POA: Diagnosis not present

## 2018-05-11 DIAGNOSIS — X58XXXA Exposure to other specified factors, initial encounter: Secondary | ICD-10-CM | POA: Diagnosis not present

## 2018-05-11 DIAGNOSIS — F17210 Nicotine dependence, cigarettes, uncomplicated: Secondary | ICD-10-CM | POA: Insufficient documentation

## 2018-05-11 LAB — POCT URINALYSIS DIP (DEVICE)
Glucose, UA: NEGATIVE mg/dL
HGB URINE DIPSTICK: NEGATIVE
LEUKOCYTES UA: NEGATIVE
NITRITE: NEGATIVE
Protein, ur: NEGATIVE mg/dL
SPECIFIC GRAVITY, URINE: 1.025 (ref 1.005–1.030)
Urobilinogen, UA: 1 mg/dL (ref 0.0–1.0)
pH: 6.5 (ref 5.0–8.0)

## 2018-05-11 MED ORDER — SULFAMETHOXAZOLE-TRIMETHOPRIM 800-160 MG PO TABS: 1.0000 | ORAL_TABLET | Freq: Two times a day (BID) | ORAL | 0 refills | Status: AC

## 2018-05-11 NOTE — ED Provider Notes (Signed)
[facility]    CSN: [id number] Arrival date & time: 05/11/18  1002     History   Chief Complaint Chief Complaint  Patient presents with  . Blister    HPI Kelsey Cooper is a 27 y.o. female.   Morrissa presents with complaints of blister to left hand pointer finger. 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 Painful, has gotten larger. No pain to the joint. No fevers. Has been taking naproxen and muscle relaxer which has not helped. Denies any previous similar. States also since yesterday has had some vaginal itching and discharge. Period just stopped yesterday. Has an IUD. No pelvic or back pain. No urinary symptoms. Has one sexual partner, denies concerns for stds today. States feels similar to bv she has had in the past. Hx of anxiety, gerd, de(ression.     ROS per HPI.      Past Medical History:  Diagnosis Date  . Anxiety   . GERD (gastroesophageal reflux disease)   . Severe depression [facility])     Patient Active Problem List   Diagnosis Date Noted  . [redacted] weeks gestation of pregnancy 04/27/2017    Past Surgical History:  Procedure Laterality Date  . NO PAST SURGERIES      OB History    Gravida  5   Para  3   Term  3   Preterm  0   AB  2   Living  3     SAB  1   TAB  1   Ectopic      Multiple  0   Live Births  3            Home Medications    Prior to Admission medications   Medication Sig Start Date End Date Taking? Authorizing Provider  BuPROPion HCl (WELLBUTRIN PO) Take by mouth.   Yes [provider]  cyclobenzaprine (FLEXERIL) 10 MG tablet Take 1 tablet (10 mg total) by mouth 3 (three) times daily as needed for muscle spasms. 08/30/17  Yes Molpus, John, MD  naproxen (NAPROSYN) 375 MG tablet Take 1 tablet (375 mg total) by mouth 2 (two) times daily as needed (for pain). 08/30/17  Yes Molpus, Jonny Ruiz, MD  ondansetron (ZOFRAN ODT) 8 MG disintegrating tablet Take 1 tablet (8 mg total) by mouth every  8 (eight) hours as needed for nausea or vomiting. 08/30/17   Molpus, John, MD  sulfamethoxazole-trimethoprim (BACTRIM DS) 800-160 MG tablet Take 1 tablet by mouth 2 (two) times daily for 5 days. 05/11/18 05/16/18  Georgetta Haber, NP    Family History Family History  Problem Relation Age of Onset  . Diabetes Father   . Hypertension Paternal Grandmother     Social History Social History   Tobacco Use  . Smoking status: Current Every Day Smoker    Packs/day: 1.00    Types: Cigarettes  . Smokeless tobacco: Former Engineer, water Use Topics  . Alcohol use: No  . Drug use: No     Allergies   Patient has no known allergies.   Review of Systems Review of Systems   Physical Exam Triage Vital Signs ED Triage Vitals  Enc Vitals Group     BP 05/11/18 1026 117/79     Pulse Rate 05/11/18 1026 70     Resp 05/11/18 1026 17     Temp 05/11/18 1026 (!) 97.4 F (36.3 C)     Temp Source 05/11/18 1026 Oral     SpO2  05/11/18 1026 100 %     Weight --      Height --      Head Circumference --      Peak Flow --      Pain Score 05/11/18 1030 10     Pain Loc --      Pain Edu? --      Excl. in GC? --    No data found.  Updated Vital Signs BP 117/79 (BP Location: Left Arm)   Pulse 70   Temp (!) 97.4 F (36.3 C) (Oral)   Resp 17   SpO2 100%    Physical Exam  Constitutional: She is oriented to person, place, and time. She appears well-developed and well-nourished. No distress.  Cardiovascular: Normal rate, regular rhythm and normal heart sounds.  Pulmonary/Chest: Effort normal and breath sounds normal.  Abdominal: Soft. There is no tenderness. There is no rigidity, no rebound, no guarding and no CVA tenderness.  Genitourinary:  Genitourinary Comments: Denies sores, lesions, vaginal bleeding; no pelvic pain; gu exam deferred at this time, vaginal self swab collected.    Musculoskeletal:  Left hand pointer finger with large blister to distal tip. Does not include entire pad of  finger; cap refill < 2 seconds; no pain or swelling to DIP joint; patient requested decompression; alcohol cleansed area and 23 g needle to tip of finger to allow for drainage of some of the fluid; abx ointment and bandaid applied   Neurological: She is alert and oriented to person, place, and time.  Skin: Skin is warm and dry.     UC Treatments / Results  Labs (all labs ordered are listed, but only abnormal results are displayed) Labs Reviewed  POCT URINALYSIS DIP (DEVICE) - Abnormal; Notable for the following components:      Result Value   Bilirubin Urine SMALL (*)    Ketones, ur TRACE (*)    All other components within normal limits  CERVICOVAGINAL ANCILLARY ONLY    EKG None  Radiology No results found.  Procedures Procedures (including critical care time)  Medications Ordered in UC Medications - No data to display  Initial Impression / Assessment and Plan / UC Course  I have reviewed the triage vital signs and the nursing notes.  Pertinent labs & imaging results that were available during my care of the patient were reviewed by me and considered in my medical decision making (see chart for details).     No indication of felon, consistent still with blister related to use of the finger. Drained a small amount of fluid, discussed infection risk as length with patient. ua unremarkable, vaginal cytology pending. 5 days of bactrim to prevent finger infection related to large blister, suspect it may help some with any BV present. Will notify of any positive findings and if any changes to treatment are needed.  If symptoms worsen or do not improve in the next week to return to be seen or to follow up with PCP.  Patient verbalized understanding and agreeable to plan.    Final Clinical Impressions(s) / UC Diagnoses   Final diagnoses:  Blister  Vaginal discharge     Discharge Instructions     Warm compresses to promote further drainage.  Keep covered to prevent infection.   Cleanse daily with soap and water.  5 days of antibiotics should treat any bacterial vaginal symptoms as well as prevent any infection to the finger.  Avoid trauma to the finger until complete healing.  If symptoms worsen  or do not improve in the next week to return to be seen or to follow up with your PCP.     ED Prescriptions    Medication Sig Dispense Auth. Provider   sulfamethoxazole-trimethoprim (BACTRIM DS) 800-160 MG tablet Take 1 tablet by mouth 2 (two) times daily for 5 days. 10 tablet Georgetta HaberBurky, [place] Kappes B, NP     Controlled Substance Prescriptions [place] Controlled Substance Registry consulted? Not Applicable   Georgetta HaberBurky, Stephanos Fan B, NP 05/11/18 1120

## 2018-05-11 NOTE — Discharge Instructions (Signed)
Warm compresses to promote further drainage.  Keep covered to prevent infection.  Cleanse daily with soap and water.  5 days of antibiotics should treat any bacterial vaginal symptoms as well as prevent any infection to the finger.  Avoid trauma to the finger until complete healing.  If symptoms worsen or do not improve in the next week to return to be seen or to follow up with your PCP.

## 2018-05-11 NOTE — ED Triage Notes (Addendum)
[facility] pointer finger on left hand since yesterday. Pt states finger is numb and painful. Pt also complains of possible urine infection, odor in urine, light discharge, and itching since yesterday

## 2018-05-13 LAB — CERVICOVAGINAL ANCILLARY ONLY
BACTERIAL VAGINITIS: NEGATIVE
CHLAMYDIA, DNA PROBE: NEGATIVE
Candida vaginitis: NEGATIVE
Neisseria Gonorrhea: NEGATIVE
Trichomonas: NEGATIVE

## 2018-05-20 ENCOUNTER — Other Ambulatory Visit: Payer: Self-pay

## 2018-05-20 ENCOUNTER — Ambulatory Visit (HOSPITAL_COMMUNITY)
Admission: EM | Admit: 2018-05-20 | Discharge: 2018-05-20 | Disposition: A | Discharge status: Home / Self Care | Payer: Medicaid Other | Attending: Family Medicine | Admitting: Family Medicine

## 2018-05-20 ENCOUNTER — Encounter (HOSPITAL_COMMUNITY): Payer: Self-pay | Admitting: Emergency Medicine

## 2018-05-20 DIAGNOSIS — J020 Streptococcal pharyngitis: Secondary | ICD-10-CM

## 2018-05-20 MED ORDER — FLUCONAZOLE 150 MG PO TABS: ORAL_TABLET | ORAL | 0 refills | Status: DC

## 2018-05-20 MED ORDER — AMOXICILLIN 875 MG PO TABS: 875.0000 mg | ORAL_TABLET | Freq: Two times a day (BID) | ORAL | 0 refills | Status: AC

## 2018-05-20 NOTE — Discharge Instructions (Signed)
You may use over the counter ibuprofen or acetaminophen as needed.  °For a sore throat, over the counter products such as [employer] Peroxyl Mouth Sore Rinse or Chloraseptic Sore Throat Spray may provide some temporary relief. ° ° ° ° °

## 2018-05-20 NOTE — ED Provider Notes (Signed)
[facility]   [id number] 05/20/18 Arrival Time: 1008  ASSESSMENT & PLAN:  1. Strep throat    Rapid strep + at outside clinic. Will treat in addition to empiric coverage for yeast infection. Does not desire repeat STI testing.  Meds ordered this encounter  Medications  . amoxicillin (AMOXIL) 875 MG tablet    Sig: Take 1 tablet (875 mg total) by mouth 2 (two) times daily for 10 days.    Dispense:  20 tablet    Refill:  0  . fluconazole (DIFLUCAN) 150 MG tablet    Sig: Take one tablet by mouth as a single dose.    Dispense:  1 tablet    Refill:  0    Results for orders placed or performed during the hospital encounter of 05/11/18  Cervicovaginal ancillary only  Result Value Ref Range   Bacterial vaginitis Negative for Bacterial Vaginitis Microorganisms    Candida vaginitis Negative for Candida species    Chlamydia Negative    Neisseria gonorrhea Negative    Trichomonas Negative     OTC analgesics and throat care as needed  Instructed to finish full 10 day course of antibiotics. Will follow up if not showing significant improvement over the next 24-48 hours.   Discharge Instructions      You may use over the counter ibuprofen or acetaminophen as needed.   For a sore throat, over the counter products such as [employer] Peroxyl Mouth Sore Rinse or Chloraseptic Sore Throat Spray may provide some temporary relief.   Reviewed expectations re: course of current medical issues. Questions answered. Outlined signs and symptoms indicating need for more acute intervention. Patient verbalized understanding. After Visit Summary given.   SUBJECTIVE:  Kelsey Cooper is a 27 y.o. female who reports a sore throat. + strep test on 8/24. Took a few days of antibiotics and started to notice white vaginal discharge with foul odor. No abd or pelvic pain. Recently tested negative for STIs. No new sexual partners. Stopped taking antibiotic and vaginal discharge has improved some. Now  throat starting to hurt again. No feer reported. Tolerating PO intake. No urinary symptoms.  OTC treatment: ibuprofen helps a little.  ROS: As per HPI.   OBJECTIVE:  Vitals:   05/20/18 1132  BP: 108/66  Pulse: 72  Resp: 18  Temp: 97.9 F (36.6 C)  TempSrc: Oral  SpO2: 100%    General appearance: alert; no distress HEENT: throat with moderate erythema without exudate; uvula midline no Neck: supple with FROM; no lymphadenopathy Lungs: clear to auscultation bilaterally Abd: soft and non-tender Skin: reveals no rash; warm and dry Psychological: alert and cooperative; normal mood and affect  No Known Allergies  Past Medical History:  Diagnosis Date  . Anxiety   . GERD (gastroesophageal reflux disease)   . Severe depression (HCC)    Social History   Socioeconomic History  . Marital status: Single    Spouse name: Not on file  . Number of children: Not on file  . Years of education: Not on file  . Highest education level: Not on file  Occupational History  . Not on file  Social Needs  . Financial resource strain: Not on file  . Food insecurity:    Worry: Not on file    Inability: Not on file  . Transportation needs:    Medical: Not on file    Non-medical: Not on file  Tobacco Use  . Smoking status: Current Every Day Smoker    Packs/day: 1.00  Types: Cigarettes  . Smokeless tobacco: Former Engineer, water and Sexual Activity  . Alcohol use: No  . Drug use: No  . Sexual activity: Yes    Birth control/protection: IUD  Lifestyle  . Physical activity:    Days per week: Not on file    Minutes per session: Not on file  . Stress: Not on file  Relationships  . Social connections:    Talks on phone: Not on file    Gets together: Not on file    Attends religious service: Not on file    Active member of club or organization: Not on file    Attends meetings of clubs or organizations: Not on file    Relationship status: Not on file  . Intimate partner violence:     Fear of current or ex partner: Not on file    Emotionally abused: Not on file    Physically abused: Not on file    Forced sexual activity: Not on file  Other Topics Concern  . Not on file  Social History Narrative  . Not on file   Family History  Problem Relation Age of Onset  . Diabetes Father   . Hypertension Paternal Dulce Sellar, MD 05/20/18 1154

## 2018-05-20 NOTE — ED Triage Notes (Signed)
8/24 seen for strep throat, started antibiotic.  Patient noticed vaginal odor, vaginal discharge.  Patient then stopped antibiotic and now throat is hurting again

## 2019-03-15 ENCOUNTER — Encounter (HOSPITAL_COMMUNITY): Payer: Self-pay | Admitting: Emergency Medicine

## 2019-03-15 ENCOUNTER — Ambulatory Visit (HOSPITAL_COMMUNITY)
Admission: EM | Admit: 2019-03-15 | Discharge: 2019-03-15 | Disposition: A | Discharge status: Home / Self Care | Payer: Medicaid Other | Attending: Family Medicine | Admitting: Family Medicine

## 2019-03-15 ENCOUNTER — Other Ambulatory Visit: Payer: Self-pay

## 2019-03-15 DIAGNOSIS — N899 Noninflammatory disorder of vagina, unspecified: Secondary | ICD-10-CM | POA: Diagnosis not present

## 2019-03-15 DIAGNOSIS — Z113 Encounter for screening for infections with a predominantly sexual mode of transmission: Secondary | ICD-10-CM

## 2019-03-15 DIAGNOSIS — Z202 Contact with and (suspected) exposure to infections with a predominantly sexual mode of transmission: Secondary | ICD-10-CM | POA: Diagnosis not present

## 2019-03-15 DIAGNOSIS — Z3202 Encounter for pregnancy test, result negative: Secondary | ICD-10-CM | POA: Diagnosis not present

## 2019-03-15 LAB — POCT URINALYSIS DIP (DEVICE)
Bilirubin Urine: NEGATIVE
Glucose, UA: NEGATIVE mg/dL
Hgb urine dipstick: NEGATIVE
Ketones, ur: NEGATIVE mg/dL
Leukocytes,Ua: NEGATIVE
Nitrite: NEGATIVE
Protein, ur: NEGATIVE mg/dL
Specific Gravity, Urine: >=1.03 (ref 1.005–1.030)
Urobilinogen, UA: 0.2 mg/dL (ref 0.0–1.0)
pH: 6.5 (ref 5.0–8.0)

## 2019-03-15 LAB — POCT PREGNANCY, URINE: Preg Test, Ur: NEGATIVE

## 2019-03-15 MED ORDER — CEFTRIAXONE SODIUM 250 MG IJ SOLR
INTRAMUSCULAR | Status: AC
  Filled 2019-03-15: qty 250

## 2019-03-15 MED ORDER — AZITHROMYCIN 250 MG PO TABS
ORAL_TABLET | ORAL | Status: AC
  Filled 2019-03-15: qty 4

## 2019-03-15 MED ORDER — CEFTRIAXONE SODIUM 250 MG IJ SOLR
250.0000 mg | Freq: Once | INTRAMUSCULAR | Status: AC
Start: 2019-03-15 — End: 2019-03-15
  Administered 2019-03-15: 250 mg via INTRAMUSCULAR

## 2019-03-15 MED ORDER — AZITHROMYCIN 250 MG PO TABS
1000.0000 mg | ORAL_TABLET | Freq: Once | ORAL | Status: AC
  Administered 2019-03-15: 15:00:00 1000 mg via ORAL

## 2019-03-15 MED ORDER — CLOTRIMAZOLE-BETAMETHASONE 1-0.05 % EX CREA: TOPICAL_CREAM | CUTANEOUS | 0 refills | Status: DC

## 2019-03-15 NOTE — Discharge Instructions (Signed)
Your urine results are normal.  STD testing is pending.  You will be notified via phone once results are available. Encouraged use of barrier protection such as a condom with sexual intercourse.  You were treated today for STDs therefore recommend avoiding any sexual intercourse for at least 7 days.

## 2019-03-15 NOTE — ED Triage Notes (Signed)
Per pt she ad sexual intercourse with someone on Sunday last week and right afterwards she has a burning sensation with urination and continuous. Pt said no smell no discharge.

## 2019-03-15 NOTE — ED Provider Notes (Signed)
[facility]    CSN: [id number] Arrival date & time: 03/15/19  1243      History   Chief Complaint Chief Complaint  Patient presents with  . Exposure to STD    HPI Kelsey Cooper is a 28 y.o. female.   HPI  Concern for STD. New sexual partner 6 days ago. Immediately after sex experienced burning inside the vagina. Reports taken a diflucan yesterday. See by PCP Wednesday who recommended an over the counter yeast infection treatment. She did not start yeast infection cream medication as it was not covered by medicaid. Continues to experience vaginal discomfort. No discharge or odor. New partner is unaware of STD status. Past Medical History:  Diagnosis Date  . Anxiety   . GERD (gastroesophageal reflux disease)   . Severe depression [facility](HCC)     Patient Active Problem List   Diagnosis Date Noted  . [redacted] weeks gestation of pregnancy 04/27/2017    Past Surgical History:  Procedure Laterality Date  . NO PAST SURGERIES      OB History    Gravida  5   Para  3   Term  3   Preterm  0   AB  2   Living  3     SAB  1   TAB  1   Ectopic      Multiple  0   Live Births  3            Home Medications    Prior to Admission medications   Medication Sig Start Date End Date Taking? Authorizing Provider  BuPROPion HCl (WELLBUTRIN PO) Take by mouth.    [provider]  fluconazole (DIFLUCAN) 150 MG tablet Take one tablet by mouth as a single dose. 05/20/18   Mardella LaymanHagler, Brian, MD    Family History Family History  Problem Relation Age of Onset  . Diabetes Father   . Hypertension Paternal Grandmother     Social History Social History   Tobacco Use  . Smoking status: Current Every Day Smoker    Packs/day: 1.00    Types: Cigarettes  . Smokeless tobacco: Former Engineer, waterUser  Substance Use Topics  . Alcohol use: No  . Drug use: No     Allergies   Patient has no known allergies.   Review of Systems Review of Systems Pertinent negatives listed  in HPI Physical Exam Triage Vital Signs ED Triage Vitals  Enc Vitals Group     BP 03/15/19 1304 138/87     Pulse Rate 03/15/19 1304 67     Resp 03/15/19 1304 16     Temp 03/15/19 1304 98.6 F (37 C)     Temp Source 03/15/19 1304 Oral     SpO2 03/15/19 1304 100 %     Weight --      Height --      Head Circumference --      Peak Flow --      Pain Score 03/15/19 1305 0     Pain Loc --      Pain Edu? --      Excl. in GC? --    No data found.  Updated Vital Signs BP 138/87 (BP Location: Right Arm)   Pulse 67   Temp 98.6 F (37 C) (Oral)   Resp 16   SpO2 100%   Visual Acuity Right Eye Distance:   Left Eye Distance:   Bilateral Distance:    Right Eye Near:   Left Eye Near:  Bilateral Near:     Physical Exam General appearance: alert, well developed, well nourished, cooperative and in no distress Head: Normocephalic, without obvious abnormality, atraumatic Respiratory: Respirations even and unlabored, normal respiratory rate Heart: rate and rhythm normal.  Extremities: No gross deformities Skin: Skin color, texture, turgor normal. No rashes seen  Psych: Appropriate mood and affect. Neurologic: Alert, oriented to person, place, and time, thought content appropriate.  UC Treatments / Results  Labs (all labs ordered are listed, but only abnormal results are displayed) Labs Reviewed - No data to display  EKG None  Radiology No results found.  Procedures Procedures (including critical care time)  Medications Ordered in UC Medications - No data to display  Initial Impression / Assessment and Plan / UC Course  I have reviewed the triage vital signs and the nursing notes.  Pertinent labs & imaging results that were available during my care of the patient were reviewed by me and considered in my medical decision making (see chart for details).   Patient treated empirically for STD.  Management specimen collected and pending.  Patient educated on the use of  condoms for detection against STDs.  Patient advised to notify any other sexual partners of pending STD testing.  Avoid sexual contact currently 7 days.  Patient verbalized understanding and agreement with plan. Final Clinical Impressions(s) / UC Diagnoses   Final diagnoses:  Possible exposure to STD   Discharge Instructions   None    ED Prescriptions    Medication Sig Dispense Auth. Provider   clotrimazole-betamethasone (LOTRISONE) cream Apply to affected area 2 times daily prn 15 g Scot Jun, FNP     Controlled Substance Prescriptions [place] Controlled Substance Registry consulted? Not Applicable   Scot Jun, [place] 03/16/19 [phone]

## 2019-03-18 LAB — CERVICOVAGINAL ANCILLARY ONLY
Bacterial vaginitis: POSITIVE — AB
Candida vaginitis: NEGATIVE
Chlamydia: NEGATIVE
Neisseria Gonorrhea: NEGATIVE
Trichomonas: NEGATIVE

## 2019-03-19 ENCOUNTER — Telehealth (HOSPITAL_COMMUNITY): Payer: Self-pay | Admitting: Emergency Medicine

## 2019-03-19 MED ORDER — METRONIDAZOLE 500 MG PO TABS: 500.0000 mg | ORAL_TABLET | Freq: Two times a day (BID) | ORAL | 0 refills | Status: AC

## 2019-03-19 NOTE — Telephone Encounter (Signed)
Bacterial vaginosis is positive. This was not treated at the urgent care visit.  Flagyl 500 mg BID x 7 days #14 no refills sent to patients pharmacy of choice.    Patient contacted and made aware of all results, all questions answered.  

## 2020-05-02 ENCOUNTER — Encounter (HOSPITAL_COMMUNITY): Payer: Self-pay | Admitting: Emergency Medicine

## 2020-05-02 ENCOUNTER — Emergency Department (HOSPITAL_COMMUNITY)
Admission: EM | Admit: 2020-05-02 | Discharge: 2020-05-03 | Disposition: A | Discharge status: Home / Self Care | Payer: Medicaid Other | Attending: Emergency Medicine | Admitting: Emergency Medicine

## 2020-05-02 ENCOUNTER — Emergency Department (HOSPITAL_COMMUNITY)
Admission: EM | Admit: 2020-05-02 | Discharge: 2020-05-02 | Discharge status: Absent without leave | Payer: Medicaid Other

## 2020-05-02 ENCOUNTER — Other Ambulatory Visit: Payer: Self-pay

## 2020-05-02 DIAGNOSIS — Z5321 Procedure and treatment not carried out due to patient leaving prior to being seen by health care provider: Secondary | ICD-10-CM | POA: Insufficient documentation

## 2020-05-02 DIAGNOSIS — Y32XXXA Crashing of motor vehicle, undetermined intent, initial encounter: Secondary | ICD-10-CM | POA: Diagnosis not present

## 2020-05-02 DIAGNOSIS — Y999 Unspecified external cause status: Secondary | ICD-10-CM | POA: Diagnosis not present

## 2020-05-02 DIAGNOSIS — Y939 Activity, unspecified: Secondary | ICD-10-CM | POA: Diagnosis not present

## 2020-05-02 DIAGNOSIS — M549 Dorsalgia, unspecified: Secondary | ICD-10-CM | POA: Diagnosis present

## 2020-05-02 DIAGNOSIS — Y929 Unspecified place or not applicable: Secondary | ICD-10-CM | POA: Insufficient documentation

## 2020-05-02 NOTE — ED Notes (Signed)
Pt told registration the did not want to wait and left
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
## 2020-05-03 ENCOUNTER — Encounter (HOSPITAL_COMMUNITY): Payer: Self-pay

## 2020-05-03 ENCOUNTER — Ambulatory Visit (HOSPITAL_COMMUNITY)
Admission: EM | Admit: 2020-05-03 | Discharge: 2020-05-03 | Disposition: A | Discharge status: Home / Self Care | Payer: Medicaid Other | Attending: Family Medicine | Admitting: Family Medicine

## 2020-05-03 ENCOUNTER — Other Ambulatory Visit: Payer: Self-pay

## 2020-05-03 DIAGNOSIS — M542 Cervicalgia: Secondary | ICD-10-CM | POA: Diagnosis not present

## 2020-05-03 DIAGNOSIS — S39012A Strain of muscle, fascia and tendon of lower back, initial encounter: Secondary | ICD-10-CM | POA: Diagnosis not present

## 2020-05-03 DIAGNOSIS — M545 Low back pain, unspecified: Secondary | ICD-10-CM

## 2020-05-03 DIAGNOSIS — S161XXA Strain of muscle, fascia and tendon at neck level, initial encounter: Secondary | ICD-10-CM | POA: Diagnosis not present

## 2020-05-03 MED ORDER — IBUPROFEN 600 MG PO TABS: 600.0000 mg | ORAL_TABLET | Freq: Four times a day (QID) | ORAL | 0 refills | Status: AC | PRN

## 2020-05-03 MED ORDER — CYCLOBENZAPRINE HCL 10 MG PO TABS: 10.0000 mg | ORAL_TABLET | Freq: Two times a day (BID) | ORAL | 0 refills | Status: DC | PRN
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
## 2020-05-03 NOTE — Discharge Instructions (Signed)
Take ibuprofen as needed for your pain.    Take the muscle relaxer Flexeril as needed for muscle spasm; Do not drive, operate machinery, or drink alcohol with this medication as it may make you drowsy.    Follow up with your primary care provider or an orthopedist if your pain is not improving.     

## 2020-07-28 ENCOUNTER — Emergency Department (HOSPITAL_COMMUNITY)
Admission: EM | Admit: 2020-07-28 | Discharge: 2020-07-28 | Disposition: A | Discharge status: Home / Self Care | Payer: Medicaid Other | Attending: Emergency Medicine | Admitting: Emergency Medicine

## 2020-07-28 ENCOUNTER — Emergency Department (HOSPITAL_COMMUNITY): Payer: Medicaid Other

## 2020-07-28 ENCOUNTER — Encounter (HOSPITAL_COMMUNITY): Payer: Self-pay

## 2020-07-28 DIAGNOSIS — S199XXA Unspecified injury of neck, initial encounter: Secondary | ICD-10-CM | POA: Diagnosis not present

## 2020-07-28 DIAGNOSIS — M7918 Myalgia, other site: Secondary | ICD-10-CM

## 2020-07-28 DIAGNOSIS — M791 Myalgia, unspecified site: Secondary | ICD-10-CM | POA: Diagnosis not present

## 2020-07-28 DIAGNOSIS — M545 Low back pain, unspecified: Secondary | ICD-10-CM | POA: Insufficient documentation

## 2020-07-28 LAB — POC URINE PREG, ED: Preg Test, Ur: NEGATIVE

## 2020-07-28 MED ORDER — METHOCARBAMOL 500 MG PO TABS: 500.0000 mg | ORAL_TABLET | Freq: Two times a day (BID) | ORAL | 0 refills | Status: AC

## 2020-07-28 NOTE — ED Notes (Addendum)
Pt states her ride home is here
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
## 2020-07-28 NOTE — Discharge Instructions (Addendum)
At this time there does not appear to be the presence of an emergent medical condition, however there is always the potential for conditions to change. Please read and follow the below instructions.  Please return to the Emergency Department immediately for any new or worsening symptoms. Please be sure to follow up with your Primary Care Provider within one week regarding your visit today; please call their office to schedule an appointment even if you are feeling better for a follow-up visit. You may use the muscle relaxer Robaxin as prescribed to help with your symptoms.  Do not drive or operate heavy machinery while taking Robaxin as it will make you drowsy.  Do not drink alcohol or take other sedating medications while taking Robaxin as this will worsen side effects. Please take Ibuprofen (Advil, motrin) and Tylenol (acetaminophen) to relieve your pain.  You may take up to 400 MG (2 pills) of normal strength ibuprofen every 8 hours as needed.  In between doses of ibuprofen you make take tylenol, up to 500 mg (one extra strength pill).  Do not take more than 3,000 mg tylenol in a 24 hour period.  Please check all medication labels as many medications such as pain and cold medications may contain tylenol.  Do not drink alcohol while taking these medications.  Do not take other NSAID'S while taking ibuprofen (such as aleve or naproxen).  Please take ibuprofen with food to decrease stomach upset.  Go to the nearest Emergency Department immediately if: You have fever or chills You have: Loss of feeling (numbness), tingling, or weakness in your arms or legs. Very bad neck pain, especially tenderness in the middle of the back of your neck. A change in your ability to control your pee or poop (stool). More pain in any area of your body. Swelling in any area of your body, especially your legs. Shortness of breath or light-headedness. Chest pain. Blood in your pee, poop, or vomit. Very bad pain in your  belly (abdomen) or your back. Very bad headaches or headaches that are getting worse. Sudden vision loss or double vision. Your eye suddenly turns red. The black center of your eye (pupil) is an odd shape or size. You have any new/concerning or worsening of symptoms   Please read the additional information packets attached to your discharge summary.  Do not take your medicine if  develop an itchy rash, swelling in your mouth or lips, or difficulty breathing; call 911 and seek immediate emergency medical attention if this occurs.  You may review your lab tests and imaging results in their entirety on your MyChart account.  Please discuss all results of fully with your primary care provider and other specialist at your follow-up visit.  Note: Portions of this text may have been transcribed using voice recognition software. Every effort was made to ensure accuracy; however, inadvertent computerized transcription errors may still be present.

## 2021-09-12 ENCOUNTER — Encounter (HOSPITAL_COMMUNITY): Payer: Self-pay

## 2021-09-12 ENCOUNTER — Other Ambulatory Visit: Payer: Self-pay

## 2021-09-12 ENCOUNTER — Ambulatory Visit (HOSPITAL_COMMUNITY)
Admission: EM | Admit: 2021-09-12 | Discharge: 2021-09-12 | Disposition: A | Discharge status: Home / Self Care | Payer: Medicaid Other | Attending: Physician Assistant | Admitting: Physician Assistant

## 2021-09-12 DIAGNOSIS — S41112A Laceration without foreign body of left upper arm, initial encounter: Secondary | ICD-10-CM

## 2021-09-12 DIAGNOSIS — Z23 Encounter for immunization: Secondary | ICD-10-CM | POA: Diagnosis not present

## 2021-09-12 MED ORDER — TETANUS-DIPHTH-ACELL PERTUSSIS 5-2.5-18.5 LF-MCG/0.5 IM SUSY
0.5000 mL | PREFILLED_SYRINGE | Freq: Once | INTRAMUSCULAR | Status: AC
  Administered 2021-09-12: 17:00:00 0.5 mL via INTRAMUSCULAR

## 2021-09-12 MED ORDER — MUPIROCIN 2 % EX OINT: 1.0000 "application " | TOPICAL_OINTMENT | Freq: Every day | CUTANEOUS | 1 refills | Status: AC

## 2021-09-12 MED ORDER — TETANUS-DIPHTH-ACELL PERTUSSIS 5-2.5-18.5 LF-MCG/0.5 IM SUSY
PREFILLED_SYRINGE | INTRAMUSCULAR | Status: AC
  Filled 2021-09-12: qty 0.5

## 2021-09-12 NOTE — Discharge Instructions (Signed)
This area appears to be healing appropriately.  Apply Bactroban ointment with dressing changes.  Your tetanus was updated today.  If you have any signs of infection including drainage, swelling, redness, increased pain you need to be seen immediately.
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
## 2022-04-07 ENCOUNTER — Ambulatory Visit: Payer: Medicaid Other | Attending: Internal Medicine | Admitting: Physical Therapy

## 2022-04-07 ENCOUNTER — Encounter: Payer: Self-pay | Admitting: Physical Therapy

## 2022-04-07 ENCOUNTER — Other Ambulatory Visit: Payer: Self-pay

## 2022-04-07 DIAGNOSIS — M6281 Muscle weakness (generalized): Secondary | ICD-10-CM | POA: Diagnosis present

## 2022-04-07 DIAGNOSIS — M542 Cervicalgia: Secondary | ICD-10-CM | POA: Insufficient documentation

## 2022-04-07 DIAGNOSIS — M545 Low back pain, unspecified: Secondary | ICD-10-CM | POA: Insufficient documentation
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
## 2022-04-18 ENCOUNTER — Ambulatory Visit: Payer: Medicaid Other | Admitting: Physical Therapy

## 2022-04-19 ENCOUNTER — Encounter: Payer: Self-pay | Admitting: Physical Therapy

## 2022-04-19 ENCOUNTER — Ambulatory Visit: Payer: Medicaid Other | Attending: Internal Medicine | Admitting: Physical Therapy

## 2022-04-19 DIAGNOSIS — M545 Low back pain, unspecified: Secondary | ICD-10-CM | POA: Insufficient documentation

## 2022-04-19 DIAGNOSIS — M6281 Muscle weakness (generalized): Secondary | ICD-10-CM | POA: Diagnosis present

## 2022-04-19 DIAGNOSIS — M542 Cervicalgia: Secondary | ICD-10-CM | POA: Diagnosis present

## 2022-04-19 NOTE — Therapy (Signed)
OUTPATIENT PHYSICAL THERAPY TREATMENT NOTE   Patient Name: Kelsey Cooper MRN: [id number] DOB:[birth year], 31 y.o., female Today's Date: 04/19/2022  PCP: Trey Sailors, PA   REFERRING PROVIDER: Benito Mccreedy, MD   PT End of Session - 04/19/22 1751     Visit Number 2    Number of Visits 4   1-2x/week   Date for PT Re-Evaluation 06/02/22    Authorization Type [facility] - ODI    Authorization Time Period Approved for 3 initial visits 04/12/22-04/26/22    PT Start Time 1750    PT Stop Time 1828    PT Time Calculation (min) 38 min             Past Medical History:  Diagnosis Date   Anxiety    GERD (gastroesophageal reflux disease)    Severe depression ([place])    Past Surgical History:  Procedure Laterality Date   NO PAST SURGERIES     Patient Active Problem List   Diagnosis Date Noted   [redacted] weeks gestation of pregnancy 04/27/2017    THERAPY DIAG:  Low back pain, unspecified back pain laterality, unspecified chronicity, unspecified whether sciatica present  Cervicalgia  Muscle weakness  REFERRING DIAG: Myalgia, other site [M79.18], Other muscle spasm [M62.838]  PERTINENT HISTORY: none  PRECAUTIONS/RESTRICTIONS:   none  SUBJECTIVE:  Pt reports that her pain is about the same.  She has been doing her exercises.  Pain:  Are you having pain? 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, bending, doing hair Relieving factors: minimal relieving symptoms Pain description: constant, dull, and aching Stage: Chronic 24 hour pattern: better in morning   OBJECTIVE:  DIAGNOSTIC FINDINGS:  X-rays clear for fracture   GENERAL OBSERVATION:          Forward head                               SENSATION:          Light touch: Appears intact           PALPATION: TTP bil lumbar paraspinals   Cervical ROM   ROM ROM  04/07/2022  Flexion N*  Extension N*  Right  lateral flexion N*  Left lateral flexion N*  Right rotation 45*  Left rotation 50*  Flexion rotation (normal is 30 degrees)    Flexion rotation (normal is 30 degrees)      (Blank rows = not tested, N = WNL, * = concordant pain)   LUMBAR AROM   AROM AROM  04/07/2022  Flexion limited by 50%, w/ concordant pain  Extension limited by 50%, w/ concordant pain  Right lateral flexion limited by 50%, w/ concordant pain  Left lateral flexion limited by 50%, w/ concordant pain  Right rotation limited by 25%, w/ concordant pain  Left rotation limited by 50%, w/ concordant pain    (Blank rows = not tested)      UPPER EXTREMITY MMT:   MMT Right 04/07/2022 Left 04/07/2022  Shoulder flexion 3+* 3+*  Shoulder abduction (C5) 3+* 3+*  Shoulder ER      Shoulder IR      Middle trapezius      Lower trapezius      Shoulder extension      Grip strength      Cervical flexion (C1,C2)      Cervical S/B (  C3)      Shoulder shrug (C4)      Elbow flexion (C6)      Elbow ext (C7)      Thumb ext (C8)      Finger abd (T1)      Grossly        (Blank rows = not tested, score listed is out of 5 possible points.  N = WNL, D = diminished, C = clear for gross weakness with myotome testing, * = concordant pain with testing)   UPPER EXTREMITY AROM:   ROM Right 04/07/2022 Left 04/07/2022  Shoulder flexion 90* 90*  Shoulder abduction 90* 90*  Shoulder internal rotation      Shoulder external rotation      Functional IR      Functional ER      Shoulder extension      Elbow extension      Elbow flexion        (Blank rows = not tested, N = WNL, * = concordant pain with testing)   LE MMT:   MMT Right 04/07/2022 Left 04/07/2022  Hip flexion (L2, L3)      Knee extension (L3)      Knee flexion      Hip abduction      Hip extension      Hip external rotation      Hip internal rotation      Hip adduction      Ankle dorsiflexion (L4)      Ankle plantarflexion (S1)      Ankle inversion      Ankle  eversion      Great Toe ext (L5)      Grossly        (Blank rows = not tested, score listed is out of 5 possible points.  N = WNL, D = diminished, C = clear for gross weakness with myotome testing, * = concordant pain with testing)   SPECIAL TESTS:           SLR (+) R           Slump (+) R     PATIENT SURVEYS:  ODI: 32% disability              TODAY'S TREATMENT:  Creating, reviewing, and completing below HEP                PATIENT EDUCATION:  POC, diagnosis, prognosis, HEP, and outcome measures.  Pt educated via explanation, demonstration, and handout (HEP).  Pt confirms understanding verbally.             HOME EXERCISE PROGRAM: Access Code: [email or web address] URL: [email or web address] Date: 04/07/2022 Prepared by: Shearon Balo   Exercises - Supine Lower Trunk Rotation  - 1 x daily - 7 x weekly - 1 sets - 20 reps - 3 hold - Seated Sciatic Tensioner  - 2 x daily - 7 x weekly - 20 reps   ASTERISK SIGNS     Asterisk Signs Eval (04/07/2022) 8/2           Slump and SLR (+)            Max and average pain 8/10 5/10 pain currently           Lumbar flexion Fingertips to knees  TREATMENT 04/19/22:  Therapeutic Exercise: - nu-step L5 79mwhile taking subjective and planning session with patient - row GTB - 3x10 - shoulder ext - GTB - 3x10 - Towel slide with thoracic ext on wall  Manual Therapy: - STM bil UT w/ pin and stretch - STM bil cervical paraspinals - sub occipital release - manual traction   ASSESSMENT:   CLINICAL IMPRESSION: Adeola did well with therapy with muscular fatigued during therex and complete resolution of pain following manual.   OBJECTIVE IMPAIRMENTS: Pain, lumbar ROM, cervical rotation ROM, UE strength, LE strength   ACTIVITY LIMITATIONS: bending, lifting, cutting hair   PERSONAL FACTORS: See medical history and pertinent history     REHAB POTENTIAL: Good   CLINICAL DECISION MAKING:  Stable/uncomplicated   EVALUATION COMPLEXITY: Low     GOALS:     SHORT TERM GOALS: Target date: 04/28/2022   TSaamiyawill be >75% HEP compliant to improve carryover between sessions and facilitate independent management of condition   Evaluation (04/07/2022): ongoing Goal status: Met 8/2     LONG TERM GOALS: Target date: 06/02/2022   TSeherwill show a >/= 12 pt improvement in their ODI score (MCID is 12 pts) as a proxy for functional improvement    Evaluation/Baseline (04/07/2022): Modified Oswestry Low Back Pain Disability Questionnaire: 16 / 50 = 32.0 % Goal status: INITIAL     2.  TCordellawill self report >/= 50% decrease in pain from evaluation    Evaluation/Baseline (04/07/2022): 8/10 max pain Goal status: INITIAL     3.  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  TShadonnawill be able to lift 25 lbs from the floor and place on a 3 foot counter, not limited by pain    Evaluation/Baseline (04/07/2022): limited by pain Goal status: INITIAL   5.  TAnquinettewill demonstrate >130 degrees of active ROM in flexion to allow completion of activities involving reaching [place]pearman not limited by pain   Evaluation/Baseline (04/07/2022): 90 degrees Goal status: INITIAL         PLAN: PT FREQUENCY: 1-2x/week   PT DURATION: 8 weeks (Ending 06/02/2022)   PLANNED INTERVENTIONS: Therapeutic exercises, Aquatic therapy, Therapeutic activity, Neuro Muscular re-education, Gait training, Patient/Family education, Joint mobilization, Dry Needling, Electrical stimulation, Spinal mobilization and/or manipulation, Moist heat, Taping, Vasopneumatic device, Ionotophoresis 468mml Dexamethasone, and Manual therapy   PLAN FOR NEXT SESSION: progressive hip and core strengthening, neural stretching (LE), MT prn   KaKevan Nyeinhartsen PT 04/19/2022, 6:29 PM

## 2022-04-26 ENCOUNTER — Encounter: Payer: Medicaid Other | Admitting: Physical Therapy

## 2022-05-16 ENCOUNTER — Ambulatory Visit: Payer: Medicaid Other

## 2022-05-16 DIAGNOSIS — M545 Low back pain, unspecified: Secondary | ICD-10-CM | POA: Diagnosis not present

## 2022-05-16 DIAGNOSIS — M542 Cervicalgia: Secondary | ICD-10-CM

## 2022-05-16 DIAGNOSIS — M6281 Muscle weakness (generalized): Secondary | ICD-10-CM

## 2022-05-16 NOTE — Therapy (Signed)
OUTPATIENT PHYSICAL THERAPY TREATMENT NOTE/RE-EVALUATION   Patient Name: Kelsey Cooper MRN: [id number] DOB:09/14/1991, 31 y.o., female Today's Date: 05/16/2022  PCP: Trey Sailors, [place]   REFERRING PROVIDER: Benito Mccreedy, MD   PT End of Session - 05/16/22 0830     Visit Number 3    Number of Visits 8    Date for PT Re-Evaluation 06/20/22    Authorization Type [facility] - ODI    Authorization Time Period --    PT Start Time 0830    PT Stop Time 0910    PT Time Calculation (min) 40 min    Activity Tolerance Patient tolerated treatment well    Behavior During Therapy WFL for tasks assessed/performed              Past Medical History:  Diagnosis Date   Anxiety    GERD (gastroesophageal reflux disease)    Severe depression ([place])    Past Surgical History:  Procedure Laterality Date   NO PAST SURGERIES     Patient Active Problem List   Diagnosis Date Noted   [redacted] weeks gestation of pregnancy 04/27/2017    THERAPY DIAG:  Low back pain, unspecified back pain laterality, unspecified chronicity, unspecified whether sciatica present  Cervicalgia  Muscle weakness  REFERRING DIAG: Myalgia, other site [M79.18], Other muscle spasm [M62.838]  PERTINENT HISTORY: None  PRECAUTIONS/RESTRICTIONS: None  SUBJECTIVE:  Pt presents to PT with continued reports of neck and lower back pain. She continues to have difficulty with prolonged standing, denies any changes to bowel/bladder or saddle anesthesia. 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   Pain:  Are you having pain? Yes Pain location: low back and neck pain NPRS scale:  current 8/10  average 5/10  Aggravating factors: standing up for long periods, bending, doing hair Relieving factors: minimal relieving symptoms Pain description: constant, dull, and aching Stage: Chronic 24 hour pattern: better in morning   OBJECTIVE:  DIAGNOSTIC FINDINGS:  X-rays clear for fracture    GENERAL OBSERVATION:          Forward head                               SENSATION:          Light touch: Appears intact           PALPATION: TTP bil lumbar paraspinals   Cervical ROM   ROM ROM  04/07/2022  Flexion N*  Extension N*  Right lateral flexion N*  Left lateral flexion N*  Right rotation 45*  Left rotation 50*  Flexion rotation (normal is 30 degrees)    Flexion rotation (normal is 30 degrees)      (Blank rows = not tested, N = WNL, * = concordant pain)   LUMBAR AROM   AROM AROM  04/07/2022  Flexion limited by 50%, w/ concordant pain  Extension limited by 50%, w/ concordant pain  Right lateral flexion limited by 50%, w/ concordant pain  Left lateral flexion limited by 50%, w/ concordant pain  Right rotation limited by 25%, w/ concordant pain  Left rotation limited by 50%, w/ concordant pain    (Blank rows = not tested)      UPPER EXTREMITY MMT:   MMT Right 04/07/2022 Left 04/07/2022  Shoulder flexion 3+* 3+*  Shoulder abduction (C5) 3+* 3+*  Shoulder ER      Shoulder IR  Middle trapezius      Lower trapezius      Shoulder extension      Grip strength      Cervical flexion (C1,C2)      Cervical S/B (C3)      Shoulder shrug (C4)      Elbow flexion (C6)      Elbow ext (C7)      Thumb ext (C8)      Finger abd (T1)      Grossly        (Blank rows = not tested, score listed is out of 5 possible points.  N = WNL, D = diminished, C = clear for gross weakness with myotome testing, * = concordant pain with testing)   UPPER EXTREMITY AROM:   ROM Right 04/07/2022 Left 04/07/2022 Right 05/16/2022 Left 05/16/2022  Shoulder flexion 90* 90* 105* 105*  Shoulder abduction 90* 90* 105* 105*  Shoulder internal rotation        Shoulder external rotation        Functional IR        Functional ER        Shoulder extension        Elbow extension        Elbow flexion          (Blank rows = not tested, N = WNL, * = concordant pain with testing)   LE  MMT:   MMT Right 04/07/2022 Left 04/07/2022  Hip flexion (L2, L3)      Knee extension (L3)      Knee flexion      Hip abduction      Hip extension      Hip external rotation      Hip internal rotation      Hip adduction      Ankle dorsiflexion (L4)      Ankle plantarflexion (S1)      Ankle inversion      Ankle eversion      Great Toe ext (L5)      Grossly        (Blank rows = not tested, score listed is out of 5 possible points.  N = WNL, D = diminished, C = clear for gross weakness with myotome testing, * = concordant pain with testing)   SPECIAL TESTS:           SLR (+) R           Slump (+) R     PATIENT SURVEYS:  ODI: 32% disability - 05/16/2022              TODAY'S TREATMENT:  [facility] Adult PT Treatment:                                                DATE: 05/16/2022 Therapeutic Exercise: Row 3x10 GTB Towel slide thoracic ext x 10 - 5" hold Seated bilat ER 2x10 RTB LTR x 10 Bridge 2x10  Manual Therapy: STM bil UT w/ pin and stretch STM bil cervical paraspinals Sub occipital release Manual cervical traction Therapeutic Activity: Reassessment of tests/measures, goals, and outcomes for discharge Modalities: MHP to lumbar spine during manual therapy in supine             PATIENT EDUCATION:  Education details: updated HEP Person educated: Patient Education method: Explanation, Demonstration, and Handouts Education comprehension:  verbalized understanding and returned demonstration            HOME EXERCISE PROGRAM: Access Code: [email or web address] URL: [email or web address] Date: 05/16/2022 Prepared by: Octavio Manns  Exercises - Supine Lower Trunk Rotation  - 1 x daily - 7 x weekly - 1 sets - 20 reps - 3 hold - Seated Sciatic Tensioner  - 2 x daily - 7 x weekly - 20 reps - Standing Shoulder Row with Anchored Resistance  - 1 x daily - 7 x weekly - 3 sets - 10 reps - green band hold - Supine Chin Tuck  - 1 x daily - 7 x weekly - 2 sets - 10 reps - 3 sec  hold   ASTERISK SIGNS     Asterisk Signs Eval (04/07/2022) 8/2  8/29         Slump and SLR (+)            Max and average pain 8/10 5/10 pain currently  8/10 currently         Lumbar flexion Fingertips to knees                                             ASSESSMENT:   CLINICAL IMPRESSION: Pt was able to complete all prescribed exercises with no increase in pain or adverse effect. She responded well to manual therapy interventions, noting decrease in pain to 2/10 at end of session. Pt demonstrates significant decrease in functional ability with her cervical spine. Her Oswestry score continues to demonstrate moderate disability in the performance of home ADLs and community activity, indicating she would continue to benefit from skilled PT services working on improving strength and function. Will continue to progress as tolerated per POC.    OBJECTIVE IMPAIRMENTS: Pain, lumbar ROM, cervical rotation ROM, UE strength, LE strength   ACTIVITY LIMITATIONS: bending, lifting, cutting hair   PERSONAL FACTORS: See medical history and pertinent history       GOALS:     SHORT TERM GOALS: Target date: 04/28/2022   Cherril will be >75% HEP compliant to improve carryover between sessions and facilitate independent management of condition   Evaluation (04/07/2022): ongoing Goal status: Met 8/2     LONG TERM GOALS: Target date: 06/02/2022   Nusayba will show a >/= 12 pt improvement in their ODI score (MCID is 12 pts) as a proxy for functional improvement    Evaluation/Baseline (04/07/2022): Modified Oswestry Low Back Pain Disability Questionnaire: 16 / 50 = 32.0 % 05/16/2022: 32% disability Goal status: ONGOING     2.  Lenya will self report >/= 50% decrease in pain from evaluation    Evaluation/Baseline (04/07/2022): 8/10 max pain 05/16/2022: 8/10 pain Goal status: ONGOING     3.  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  Michael will be able to lift 25 lbs from the floor and place on a 3 foot counter, not limited by pain    Evaluation/Baseline (04/07/2022): limited by pain 05/16/2022: continued limitation secondary to pain Goal status: ONGOING   5.  Venise will demonstrate >130 degrees of active ROM in flexion to allow completion of activities involving reaching [place], not limited by pain   Evaluation/Baseline (04/07/2022): 90 degrees 05/16/2022: 105 degrees  Goal status: ONGOING         PLAN: PT FREQUENCY: 1x/week   PT DURATION: 5 weeks (Ending 06/20/2022)   PLANNED INTERVENTIONS: Therapeutic exercises, Aquatic therapy, Therapeutic activity, Neuro Muscular re-education, Gait training, Patient/Family education, Joint mobilization, Dry Needling, Electrical stimulation, Spinal mobilization and/or manipulation, Moist heat, Taping, Vasopneumatic device, Ionotophoresis 1m/ml Dexamethasone, and Manual therapy   PLAN FOR NEXT SESSION: progressive hip and core strengthening, neural stretching (LE), MT prn   DWard ChattersPT 05/16/2022, 10:18 AM

## 2022-05-26 ENCOUNTER — Ambulatory Visit: Payer: Medicaid Other | Attending: Internal Medicine | Admitting: Physical Therapy

## 2022-05-26 ENCOUNTER — Encounter: Payer: Self-pay | Admitting: Physical Therapy

## 2022-05-26 DIAGNOSIS — M545 Low back pain, unspecified: Secondary | ICD-10-CM | POA: Diagnosis present

## 2022-05-26 DIAGNOSIS — M6281 Muscle weakness (generalized): Secondary | ICD-10-CM | POA: Insufficient documentation

## 2022-05-26 DIAGNOSIS — M542 Cervicalgia: Secondary | ICD-10-CM | POA: Diagnosis present

## 2022-05-26 NOTE — Therapy (Signed)
OUTPATIENT PHYSICAL THERAPY TREATMENT NOTE   Patient Name: Kelsey Cooper MRN: [id number] DOB:[birth year], 31 y.o., female Today's Date: 05/26/2022  PCP: Trey Sailors, [place]   REFERRING PROVIDER: Benito Mccreedy, MD   PT End of Session - 05/26/22 0831     Visit Number 4    Number of Visits 8    Date for PT Re-Evaluation 06/20/22    Authorization Type [facility] - ODI    PT Start Time 0830    PT Stop Time 0910    PT Time Calculation (min) 40 min    Activity Tolerance Patient tolerated treatment well    Behavior During Therapy WFL for tasks assessed/performed              Past Medical History:  Diagnosis Date   Anxiety    GERD (gastroesophageal reflux disease)    Severe depression ([place])    Past Surgical History:  Procedure Laterality Date   NO PAST SURGERIES     Patient Active Problem List   Diagnosis Date Noted   [redacted] weeks gestation of pregnancy 04/27/2017    THERAPY DIAG:  Low back pain, unspecified back pain laterality, unspecified chronicity, unspecified whether sciatica present  Cervicalgia  Muscle weakness  REFERRING DIAG: Myalgia, other site [M79.18], Other muscle spasm [M62.838]  PERTINENT HISTORY: None  PRECAUTIONS/RESTRICTIONS: None  SUBJECTIVE:  Pt reports that she continues to have low back and neck pain.  She also states feels that therapy has significantly helped relieve her pain and makes her feel loser.  Pain:  Are you having pain? 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, bending, doing hair Relieving factors: minimal relieving symptoms Pain description: constant, dull, and aching Stage: Chronic 24 hour pattern: better in morning   OBJECTIVE:  DIAGNOSTIC FINDINGS:  X-rays clear for fracture   GENERAL OBSERVATION:          Forward head                               SENSATION:          Light touch: Appears intact            PALPATION: TTP bil lumbar paraspinals   Cervical ROM   ROM ROM  04/07/2022  Flexion N*  Extension N*  Right lateral flexion N*  Left lateral flexion N*  Right rotation 45*  Left rotation 50*  Flexion rotation (normal is 30 degrees)    Flexion rotation (normal is 30 degrees)      (Blank rows = not tested, N = WNL, * = concordant pain)   LUMBAR AROM   AROM AROM  04/07/2022  Flexion limited by 50%, w/ concordant pain  Extension limited by 50%, w/ concordant pain  Right lateral flexion limited by 50%, w/ concordant pain  Left lateral flexion limited by 50%, w/ concordant pain  Right rotation limited by 25%, w/ concordant pain  Left rotation limited by 50%, w/ concordant pain    (Blank rows = not tested)      UPPER EXTREMITY MMT:   MMT Right 04/07/2022 Left 04/07/2022  Shoulder flexion 3+* 3+*  Shoulder abduction (C5) 3+* 3+*  Shoulder ER      Shoulder IR      Middle trapezius      Lower trapezius      Shoulder extension  Grip strength      Cervical flexion (C1,C2)      Cervical S/B (C3)      Shoulder shrug (C4)      Elbow flexion (C6)      Elbow ext (C7)      Thumb ext (C8)      Finger abd (T1)      Grossly        (Blank rows = not tested, score listed is out of 5 possible points.  N = WNL, D = diminished, C = clear for gross weakness with myotome testing, * = concordant pain with testing)   UPPER EXTREMITY AROM:   ROM Right 04/07/2022 Left 04/07/2022 Right 05/16/2022 Left 05/16/2022  Shoulder flexion 90* 90* 105* 105*  Shoulder abduction 90* 90* 105* 105*  Shoulder internal rotation        Shoulder external rotation        Functional IR        Functional ER        Shoulder extension        Elbow extension        Elbow flexion          (Blank rows = not tested, N = WNL, * = concordant pain with testing)   LE MMT:   MMT Right 04/07/2022 Left 04/07/2022  Hip flexion (L2, L3)      Knee extension (L3)      Knee flexion      Hip abduction       Hip extension      Hip external rotation      Hip internal rotation      Hip adduction      Ankle dorsiflexion (L4)      Ankle plantarflexion (S1)      Ankle inversion      Ankle eversion      Great Toe ext (L5)      Grossly        (Blank rows = not tested, score listed is out of 5 possible points.  N = WNL, D = diminished, C = clear for gross weakness with myotome testing, * = concordant pain with testing)   SPECIAL TESTS:           SLR (+) R           Slump (+) R     PATIENT SURVEYS:  ODI: 32% disability - 05/16/2022              TODAY'S TREATMENT:  [facility] Adult PT Treatment:                                                DATE: 05/26/2022 Therapeutic Exercise: nu-step L5 48mwhile taking subjective and planning session with patient Row 3x10 Blue TB Towel slide thoracic ext 20x Seated bilat ER 2x10 GTB Horizontal shoulder abd - 2x10 LTR x 10 Bridge 2x10  Manual Therapy: STM bil UT w/ pin and stretch STM bil cervical paraspinals Sub occipital release Manual cervical traction  Modalities: MHP to lumbar spine during manual therapy in supine             PATIENT EDUCATION:  Education details: updated HEP Person educated: Patient Education method: Explanation, Demonstration, and Handouts Education comprehension: verbalized understanding and returned demonstration            HOME  EXERCISE PROGRAM: Access Code: [email or web address] URL: [email or web address] Date: 05/16/2022 Prepared by: Octavio Manns  Exercises - Supine Lower Trunk Rotation  - 1 x daily - 7 x weekly - 1 sets - 20 reps - 3 hold - Seated Sciatic Tensioner  - 2 x daily - 7 x weekly - 20 reps - Standing Shoulder Row with Anchored Resistance  - 1 x daily - 7 x weekly - 3 sets - 10 reps - green band hold - Supine Chin Tuck  - 1 x daily - 7 x weekly - 2 sets - 10 reps - 3 sec hold   ASTERISK SIGNS     Asterisk Signs Eval (04/07/2022) 8/2  8/29  9/8       Slump and SLR (+)            Max and average  pain 8/10 5/10 pain currently  8/10 currently  6/10 avg       Lumbar flexion Fingertips to knees                                             ASSESSMENT:   CLINICAL IMPRESSION: Kelsey Cooper tolerated session well with no adverse reaction.  She reports significant pain reduction to 2/10 following manual.  We were able to increase overall volume and intensity of exercise today.  Will continue to progress as tolerated.   OBJECTIVE IMPAIRMENTS: Pain, lumbar ROM, cervical rotation ROM, UE strength, LE strength   ACTIVITY LIMITATIONS: bending, lifting, cutting hair   PERSONAL FACTORS: See medical history and pertinent history       GOALS:     SHORT TERM GOALS: Target date: 04/28/2022   Kelsey Cooper will be >75% HEP compliant to improve carryover between sessions and facilitate independent management of condition   Evaluation (04/07/2022): ongoing Goal status: Met 8/2     LONG TERM GOALS: Target date: 06/02/2022 (extended to 10/3)   Kelsey Cooper will show a >/= 12 pt improvement in their ODI score (MCID is 12 pts) as a proxy for functional improvement    Evaluation/Baseline (04/07/2022): Modified Oswestry Low Back Pain Disability Questionnaire: 16 / 50 = 32.0 % 05/16/2022: 32% disability Goal status: ONGOING     2.  Kelsey Cooper will self report >/= 50% decrease in pain from evaluation    Evaluation/Baseline (04/07/2022): 8/10 max pain 05/16/2022: 8/10 pain Goal status: ONGOING     3.  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  Kelsey Cooper will be able to lift 25 lbs from the floor and place on a 3 foot counter, not limited by pain    Evaluation/Baseline (04/07/2022): limited by pain 05/16/2022: continued limitation secondary to pain Goal status: ONGOING   5.  Kelsey Cooper will demonstrate >130 degrees of active ROM in flexion to allow completion of activities involving reaching [place],  not limited by pain   Evaluation/Baseline (04/07/2022): 90 degrees 05/16/2022: 105 degrees Goal status: ONGOING         PLAN: PT FREQUENCY: 1x/week   PT DURATION: 5 weeks (Ending 06/20/2022)   PLANNED INTERVENTIONS: Therapeutic exercises, Aquatic therapy, Therapeutic activity, Neuro Muscular re-education, Gait training, Patient/Family education, Joint mobilization, Dry Needling, Electrical stimulation, Spinal mobilization and/or manipulation, Moist heat, Taping, Vasopneumatic device, Ionotophoresis 59m/ml Dexamethasone, and Manual therapy  PLAN FOR NEXT SESSION: progressive hip and core strengthening, neural stretching (LE), MT prn   Kevan Ny Anaira Seay PT 05/26/2022, 9:56 AM

## 2022-06-05 ENCOUNTER — Encounter: Payer: Self-pay | Admitting: Physical Therapy

## 2022-06-05 ENCOUNTER — Ambulatory Visit: Payer: Medicaid Other | Admitting: Physical Therapy

## 2022-06-05 DIAGNOSIS — M542 Cervicalgia: Secondary | ICD-10-CM

## 2022-06-05 DIAGNOSIS — M6281 Muscle weakness (generalized): Secondary | ICD-10-CM

## 2022-06-05 DIAGNOSIS — M545 Low back pain, unspecified: Secondary | ICD-10-CM | POA: Diagnosis not present

## 2022-06-05 NOTE — Therapy (Signed)
OUTPATIENT PHYSICAL THERAPY TREATMENT NOTE   Patient Name: Kelsey Cooper MRN: [id number] DOB:[birth year], 31 y.o., female Today's Date: 06/05/2022  PCP: Kelsey Sailors, PA   REFERRING PROVIDER: Benito Mccreedy, MD   PT End of Session - 06/05/22 0846     Visit Number 5    Number of Visits 8    Date for PT Re-Evaluation 06/20/22    Authorization Type [place] [facility] - ODI    PT Start Time 0846   pt late   PT Stop Time 0910    PT Time Calculation (min) 24 min    Activity Tolerance Patient tolerated treatment well    Behavior During Therapy WFL for tasks assessed/performed              Past Medical History:  Diagnosis Date   Anxiety    GERD (gastroesophageal reflux disease)    Severe depression (Kelsey Cooper)    Past Surgical History:  Procedure Laterality Date   NO PAST SURGERIES     Patient Active Problem List   Diagnosis Date Noted   [redacted] weeks gestation of pregnancy 04/27/2017    THERAPY DIAG:  Low back pain, unspecified back pain laterality, unspecified chronicity, unspecified whether sciatica present  Cervicalgia  Muscle weakness  REFERRING DIAG: Myalgia, other site [M79.18], Other muscle spasm [M62.838]  PERTINENT HISTORY: None  PRECAUTIONS/RESTRICTIONS: None  SUBJECTIVE:  Pt reports that she continues to have low back and neck pain.  She also states feels that therapy has significantly helped relieve her pain and makes her feel loser.  Pain:  Are you having pain? 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, bending, doing hair Relieving factors: minimal relieving symptoms Pain description: constant, dull, and aching Stage: Chronic 24 hour pattern: better in morning   OBJECTIVE:  DIAGNOSTIC FINDINGS:  X-rays clear for fracture   GENERAL OBSERVATION:          Forward head                               SENSATION:          Light touch: Appears intact            PALPATION: TTP bil lumbar paraspinals   Cervical ROM   ROM ROM  04/07/2022  Flexion N*  Extension N*  Right lateral flexion N*  Left lateral flexion N*  Right rotation 45*  Left rotation 50*  Flexion rotation (normal is 30 degrees)    Flexion rotation (normal is 30 degrees)      (Blank rows = not tested, N = WNL, * = concordant pain)   LUMBAR AROM   AROM AROM  04/07/2022  Flexion limited by 50%, w/ concordant pain  Extension limited by 50%, w/ concordant pain  Right lateral flexion limited by 50%, w/ concordant pain  Left lateral flexion limited by 50%, w/ concordant pain  Right rotation limited by 25%, w/ concordant pain  Left rotation limited by 50%, w/ concordant pain    (Blank rows = not tested)      UPPER EXTREMITY MMT:   MMT Right 04/07/2022 Left 04/07/2022  Shoulder flexion 3+* 3+*  Shoulder abduction (C5) 3+* 3+*  Shoulder ER      Shoulder IR      Middle trapezius      Lower trapezius      Shoulder extension  Grip strength      Cervical flexion (C1,C2)      Cervical S/B (C3)      Shoulder shrug (C4)      Elbow flexion (C6)      Elbow ext (C7)      Thumb ext (C8)      Finger abd (T1)      Grossly        (Blank rows = not tested, score listed is out of 5 possible points.  N = WNL, D = diminished, C = clear for gross weakness with myotome testing, * = concordant pain with testing)   UPPER EXTREMITY AROM:   ROM Right 04/07/2022 Left 04/07/2022 Right 05/16/2022 Left 05/16/2022  Shoulder flexion 90* 90* 105* 105*  Shoulder abduction 90* 90* 105* 105*  Shoulder internal rotation        Shoulder external rotation        Functional IR        Functional ER        Shoulder extension        Elbow extension        Elbow flexion          (Blank rows = not tested, N = WNL, * = concordant pain with testing)   LE MMT:   MMT Right 04/07/2022 Left 04/07/2022  Hip flexion (L2, L3)      Knee extension (L3)      Knee flexion      Hip abduction       Hip extension      Hip external rotation      Hip internal rotation      Hip adduction      Ankle dorsiflexion (L4)      Ankle plantarflexion (S1)      Ankle inversion      Ankle eversion      Great Toe ext (L5)      Grossly        (Blank rows = not tested, score listed is out of 5 possible points.  N = WNL, D = diminished, C = clear for gross weakness with myotome testing, * = concordant pain with testing)   SPECIAL TESTS:           SLR (+) R           Slump (+) R     PATIENT SURVEYS:  ODI: 32% disability - 05/16/2022              TODAY'S TREATMENT:  [facility] Adult PT Treatment:                                                DATE: 05/26/2022 Therapeutic Exercise: nu-step L7 49mwhile taking subjective and planning session with patient Row 3x10 Black TB Towel slide thoracic ext 20x (NT) Seated bilat ER 2x10 GTB Horizontal shoulder abd - GTB - 2x10 Bridge 3x10   Manual Therapy: STM bil UT w/ pin and stretch STM bil cervical paraspinals Sub occipital release Manual cervical traction                     HOME EXERCISE PROGRAM: Access Code: [email or web address]: [email or web address] Date: 05/16/2022 Prepared by: Kelsey Cooper Exercises - Supine Lower Trunk Rotation  - 1 x daily - 7 x weekly - 1 sets - 20 reps -  3 hold - Seated Sciatic Tensioner  - 2 x daily - 7 x weekly - 20 reps - Standing Shoulder Row with Anchored Resistance  - 1 x daily - 7 x weekly - 3 sets - 10 reps - green band hold - Supine Chin Tuck  - 1 x daily - 7 x weekly - 2 sets - 10 reps - 3 sec hold   ASTERISK SIGNS     Asterisk Signs Eval (04/07/2022) 8/2  8/29  9/8  9/18     Slump and SLR (+)            Max and average pain 8/10 5/10 pain currently  8/10 currently  6/10 avg  4-5/10     Lumbar flexion Fingertips to knees        Mid shin                                     ASSESSMENT:   CLINICAL IMPRESSION: Kelsey Cooper tolerated session well with no adverse reaction.  Kelsey Cooper continues to  demonstrate reduced baseline pain and shows improved lumbar flexion on exam today.  She seems to be responding well to a combination of strengthening and manual therapy with complete resolution of her pain today.   OBJECTIVE IMPAIRMENTS: Pain, lumbar ROM, cervical rotation ROM, UE strength, LE strength   ACTIVITY LIMITATIONS: bending, lifting, cutting hair   PERSONAL FACTORS: See medical history and pertinent history       GOALS:     SHORT TERM GOALS: Target date: 04/28/2022   Sheray will be >75% HEP compliant to improve carryover between sessions and facilitate independent management of condition   Evaluation (04/07/2022): ongoing Goal status: Met 8/2     LONG TERM GOALS: Target date: 06/02/2022 (extended to 10/3)   Najwa will show a >/= 12 pt improvement in their ODI score (MCID is 12 pts) as a proxy for functional improvement    Evaluation/Baseline (04/07/2022): Modified Oswestry Low Back Pain Disability Questionnaire: 16 / 50 = 32.0 % 05/16/2022: 32% disability Goal status: ONGOING     2.  Gwyneth will self report >/= 50% decrease in pain from evaluation    Evaluation/Baseline (04/07/2022): 8/10 max pain 05/16/2022: 8/10 pain Goal status: ONGOING     3.  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  Karista will be able to lift 25 lbs from the floor and place on a 3 foot counter, not limited by pain    Evaluation/Baseline (04/07/2022): limited by pain 05/16/2022: continued limitation secondary to pain Goal status: ONGOING   5.  Loise will demonstrate >130 degrees of active ROM in flexion to allow completion of activities involving reaching [place], not limited by pain   Evaluation/Baseline (04/07/2022): 90 degrees 05/16/2022: 105 degrees Goal status: ONGOING         PLAN: PT FREQUENCY: 1x/week   PT DURATION: 5 weeks (Ending 06/20/2022)    PLANNED INTERVENTIONS: Therapeutic exercises, Aquatic therapy, Therapeutic activity, Neuro Muscular re-education, Gait training, Patient/Family education, Joint mobilization, Dry Needling, Electrical stimulation, Spinal mobilization and/or manipulation, Moist heat, Taping, Vasopneumatic device, Ionotophoresis 67m/ml Dexamethasone, and Manual therapy   PLAN FOR NEXT SESSION: progressive hip and core strengthening, neural stretching (LE), MT prn   KKevan NyReinhartsen PT 06/05/2022, 9:16 AM

## 2022-06-15 ENCOUNTER — Ambulatory Visit: Payer: Medicaid Other

## 2022-06-15 DIAGNOSIS — M542 Cervicalgia: Secondary | ICD-10-CM

## 2022-06-15 DIAGNOSIS — M545 Low back pain, unspecified: Secondary | ICD-10-CM

## 2022-06-15 DIAGNOSIS — M6281 Muscle weakness (generalized): Secondary | ICD-10-CM

## 2022-06-15 NOTE — Therapy (Signed)
OUTPATIENT PHYSICAL THERAPY TREATMENT NOTE   Patient Name: Kelsey Cooper MRN: [id number] DOB:08/27/1991, 31 y.o., female Today's Date: 06/15/2022  PCP: Vanstory, Ashley N, PA   REFERRING PROVIDER: Osei-Bonsu, George, MD   PT End of Session - 06/15/22 1211     Visit Number 6    Number of Visits 8    Date for PT Re-Evaluation 06/20/22    Authorization Type [place] [facility] - ODI    PT Start Time 1215    PT Stop Time 1255    PT Time Calculation (min) 40 min    Activity Tolerance Patient tolerated treatment well    Behavior During Therapy WFL for tasks assessed/performed               Past Medical History:  Diagnosis Date   Anxiety    GERD (gastroesophageal reflux disease)    Severe depression ([facility])    Past Surgical History:  Procedure Laterality Date   NO PAST SURGERIES     Patient Active Problem List   Diagnosis Date Noted   [redacted] weeks gestation of pregnancy 04/27/2017    THERAPY DIAG:  Low back pain, unspecified back pain laterality, unspecified chronicity, unspecified whether sciatica present  Cervicalgia  Muscle weakness  REFERRING DIAG: Myalgia, other site [M79.18], Other muscle spasm [M62.838]  PERTINENT HISTORY: None  PRECAUTIONS/RESTRICTIONS: None  SUBJECTIVE:  Pt presents with continued pain in neck and lower back. Ready to begin PT at this time.   Pain:  Are you having pain? 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, bending, doing hair Relieving factors: minimal relieving symptoms Pain description: constant, dull, and aching Stage: Chronic 24 hour pattern: better in morning   OBJECTIVE:  DIAGNOSTIC FINDINGS:  X-rays clear for fracture   GENERAL OBSERVATION:          Forward head                               SENSATION:          Light touch: Appears intact           PALPATION: TTP bil lumbar paraspinals   Cervical ROM   ROM ROM  04/07/2022   Flexion N*  Extension N*  Right lateral flexion N*  Left lateral flexion N*  Right rotation 45*  Left rotation 50*  Flexion rotation (normal is 30 degrees)    Flexion rotation (normal is 30 degrees)      (Blank rows = not tested, N = WNL, * = concordant pain)   LUMBAR AROM   AROM AROM  04/07/2022  Flexion limited by 50%, w/ concordant pain  Extension limited by 50%, w/ concordant pain  Right lateral flexion limited by 50%, w/ concordant pain  Left lateral flexion limited by 50%, w/ concordant pain  Right rotation limited by 25%, w/ concordant pain  Left rotation limited by 50%, w/ concordant pain    (Blank rows = not tested)      UPPER EXTREMITY MMT:   MMT Right 04/07/2022 Left 04/07/2022  Shoulder flexion 3+* 3+*  Shoulder abduction (C5) 3+* 3+*  Shoulder ER      Shoulder IR      Middle trapezius      Lower trapezius      Shoulder extension      Grip strength      Cervical flexion (C1,C2)        Cervical S/B (C3)      Shoulder shrug (C4)      Elbow flexion (C6)      Elbow ext (C7)      Thumb ext (C8)      Finger abd (T1)      Grossly        (Blank rows = not tested, score listed is out of 5 possible points.  N = WNL, D = diminished, C = clear for gross weakness with myotome testing, * = concordant pain with testing)   UPPER EXTREMITY AROM:   ROM Right 04/07/2022 Left 04/07/2022 Right 05/16/2022 Left 05/16/2022  Shoulder flexion 90* 90* 105* 105*  Shoulder abduction 90* 90* 105* 105*  Shoulder internal rotation        Shoulder external rotation        Functional IR        Functional ER        Shoulder extension        Elbow extension        Elbow flexion          (Blank rows = not tested, N = WNL, * = concordant pain with testing)   LE MMT:   MMT Right 04/07/2022 Left 04/07/2022  Hip flexion (L2, L3)      Knee extension (L3)      Knee flexion      Hip abduction      Hip extension      Hip external rotation      Hip internal rotation      Hip  adduction      Ankle dorsiflexion (L4)      Ankle plantarflexion (S1)      Ankle inversion      Ankle eversion      Great Toe ext (L5)      Grossly        (Blank rows = not tested, score listed is out of 5 possible points.  N = WNL, D = diminished, C = clear for gross weakness with myotome testing, * = concordant pain with testing)   SPECIAL TESTS:           SLR (+) R           Slump (+) R     PATIENT SURVEYS:  ODI: 32% disability - 05/16/2022              TODAY'S TREATMENT:  [facility] Adult PT Treatment:                                                DATE: 06/15/2022 Therapeutic Exercise: NuStep L7 30mwhile taking subjective and planning session with patient Row x 10 17# Seated bilateral ER GTB 2x10 Horizontal shoulder abd - GTB - 2x10 Bridge 2x10 LTR x 10  SLR 2x10 each Supine clamshell 2x15 GTB Supine PPT x 10 - 5" hold   Manual Therapy: STM bil UT w/ pin and stretch STM bil cervical paraspinals Sub occipital release Manual cervical traction                     HOME EXERCISE PROGRAM: Access Code: [email or web address]: [email or web address] Date: 05/16/2022 Prepared by: DOctavio Manns Exercises - Supine Lower Trunk Rotation  - 1 x daily - 7 x weekly - 1 sets - 20 reps - 3 hold -  Seated Sciatic Tensioner  - 2 x daily - 7 x weekly - 20 reps - Standing Shoulder Row with Anchored Resistance  - 1 x daily - 7 x weekly - 3 sets - 10 reps - green band hold - Supine Chin Tuck  - 1 x daily - 7 x weekly - 2 sets - 10 reps - 3 sec hold   ASTERISK SIGNS     Asterisk Signs Eval (04/07/2022) 8/2  8/29  9/8  9/18     Slump and SLR (+)            Max and average pain 8/10 5/10 pain currently  8/10 currently  6/10 avg  4-5/10     Lumbar flexion Fingertips to knees        Mid shin                                     ASSESSMENT:   CLINICAL IMPRESSION: Pt was able to complete all prescribed exercises with no adverse effect. Therapy focused on improving core and periscapular  strength in order to decrease pain and improve comfort. Responded well to manual therapy interventions, reports decrease in pain post session. Will continue per POC as prescribed.    OBJECTIVE IMPAIRMENTS: Pain, lumbar ROM, cervical rotation ROM, UE strength, LE strength   ACTIVITY LIMITATIONS: bending, lifting, cutting hair   PERSONAL FACTORS: See medical history and pertinent history       GOALS:     SHORT TERM GOALS: Target date: 04/28/2022   Waynesha will be >75% HEP compliant to improve carryover between sessions and facilitate independent management of condition   Evaluation (04/07/2022): ongoing Goal status: Met 8/2     LONG TERM GOALS: Target date: 06/02/2022 (extended to 10/3)   Orpha will show a >/= 12 pt improvement in their ODI score (MCID is 12 pts) as a proxy for functional improvement    Evaluation/Baseline (04/07/2022): Modified Oswestry Low Back Pain Disability Questionnaire: 16 / 50 = 32.0 % 05/16/2022: 32% disability Goal status: ONGOING     2.  Reigan will self report >/= 50% decrease in pain from evaluation    Evaluation/Baseline (04/07/2022): 8/10 max pain 05/16/2022: 8/10 pain Goal status: ONGOING     3.  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  Walsie will be able to lift 25 lbs from the floor and place on a 3 foot counter, not limited by pain    Evaluation/Baseline (04/07/2022): limited by pain 05/16/2022: continued limitation secondary to pain Goal status: ONGOING   5.  Johany will demonstrate >130 degrees of active ROM in flexion to allow completion of activities involving reaching [place], not limited by pain   Evaluation/Baseline (04/07/2022): 90 degrees 05/16/2022: 105 degrees Goal status: ONGOING         PLAN: PT FREQUENCY: 1x/week   PT DURATION: 5 weeks (Ending 06/20/2022)   PLANNED INTERVENTIONS: Therapeutic  exercises, Aquatic therapy, Therapeutic activity, Neuro Muscular re-education, Gait training, Patient/Family education, Joint mobilization, Dry Needling, Electrical stimulation, Spinal mobilization and/or manipulation, Moist heat, Taping, Vasopneumatic device, Ionotophoresis 18m/ml Dexamethasone, and Manual therapy   PLAN FOR NEXT SESSION: progressive hip and core strengthening, neural stretching (LE), MT prn   DWard ChattersPT 06/15/2022, 12:55 PM

## 2022-06-22 ENCOUNTER — Ambulatory Visit: Payer: Medicaid Other | Admitting: Physical Therapy

## 2022-06-22 ENCOUNTER — Ambulatory Visit: Payer: Medicaid Other

## 2022-06-26 NOTE — Therapy (Signed)
OUTPATIENT PHYSICAL THERAPY TREATMENT NOTE   Patient Name: Kelsey Cooper MRN: [id number] DOB:04-17-1991, 31 y.o., female Today's Date: 06/26/2022  PCP: Trey Sailors, PA   REFERRING PROVIDER: Benito Mccreedy, MD       Past Medical History:  Diagnosis Date   Anxiety    GERD (gastroesophageal reflux disease)    Severe depression (Joy)    Past Surgical History:  Procedure Laterality Date   NO PAST SURGERIES     Patient Active Problem List   Diagnosis Date Noted   [redacted] weeks gestation of pregnancy 04/27/2017    THERAPY DIAG:  No diagnosis found.  REFERRING DIAG: Myalgia, other site [M79.18], Other muscle spasm [M62.838]  PERTINENT HISTORY: None  PRECAUTIONS/RESTRICTIONS: None  SUBJECTIVE:  ***  Pain:  Are you having pain? 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, bending, doing hair Relieving factors: minimal relieving symptoms Pain description: constant, dull, and aching Stage: Chronic 24 hour pattern: better in morning   OBJECTIVE:  DIAGNOSTIC FINDINGS:  X-rays clear for fracture   GENERAL OBSERVATION:          Forward head                               SENSATION:          Light touch: Appears intact           PALPATION: TTP bil lumbar paraspinals   Cervical ROM   ROM ROM  04/07/2022  Flexion N*  Extension N*  Right lateral flexion N*  Left lateral flexion N*  Right rotation 45*  Left rotation 50*  Flexion rotation (normal is 30 degrees)    Flexion rotation (normal is 30 degrees)      (Blank rows = not tested, N = WNL, * = concordant pain)   LUMBAR AROM   AROM AROM  04/07/2022  Flexion limited by 50%, w/ concordant pain  Extension limited by 50%, w/ concordant pain  Right lateral flexion limited by 50%, w/ concordant pain  Left lateral flexion limited by 50%, w/ concordant pain  Right rotation limited by 25%, w/ concordant pain   Left rotation limited by 50%, w/ concordant pain    (Blank rows = not tested)      UPPER EXTREMITY MMT:   MMT Right 04/07/2022 Left 04/07/2022  Shoulder flexion 3+* 3+*  Shoulder abduction (C5) 3+* 3+*  Shoulder ER      Shoulder IR      Middle trapezius      Lower trapezius      Shoulder extension      Grip strength      Cervical flexion (C1,C2)      Cervical S/B (C3)      Shoulder shrug (C4)      Elbow flexion (C6)      Elbow ext (C7)      Thumb ext (C8)      Finger abd (T1)      Grossly        (Blank rows = not tested, score listed is out of 5 possible points.  N = WNL, D = diminished, C = clear for gross weakness with myotome testing, * = concordant pain with testing)   UPPER EXTREMITY AROM:   ROM Right 04/07/2022 Left 04/07/2022 Right 05/16/2022 Left 05/16/2022  Shoulder flexion 90* 90* 105* 105*  Shoulder abduction  90* 90* 105* 105*  Shoulder internal rotation        Shoulder external rotation        Functional IR        Functional ER        Shoulder extension        Elbow extension        Elbow flexion          (Blank rows = not tested, N = WNL, * = concordant pain with testing)   LE MMT:   MMT Right 04/07/2022 Left 04/07/2022  Hip flexion (L2, L3)      Knee extension (L3)      Knee flexion      Hip abduction      Hip extension      Hip external rotation      Hip internal rotation      Hip adduction      Ankle dorsiflexion (L4)      Ankle plantarflexion (S1)      Ankle inversion      Ankle eversion      Great Toe ext (L5)      Grossly        (Blank rows = not tested, score listed is out of 5 possible points.  N = WNL, D = diminished, C = clear for gross weakness with myotome testing, * = concordant pain with testing)   SPECIAL TESTS:           SLR (+) R           Slump (+) R     PATIENT SURVEYS:  ODI: 32% disability - 05/16/2022              TODAY'S TREATMENT:  [facility] Adult PT Treatment:                                                DATE:  06/27/2022 Therapeutic Exercise: NuStep L7 69mwhile taking subjective and planning session with patient Row x 10 17# Seated bilateral ER GTB 2x10 Horizontal shoulder abd - GTB - 2x10 Bridge 2x10 LTR x 10  SLR 2x10 each Supine clamshell 2x15 GTB Supine PPT x 10 - 5" hold   Manual Therapy: STM bil UT w/ pin and stretch STM bil cervical paraspinals Sub occipital release Manual cervical traction  [facility] Adult PT Treatment:                                                DATE: 06/15/2022 Therapeutic Exercise: NuStep L7 573mhile taking subjective and planning session with patient Row x 10 17# Seated bilateral ER GTB 2x10 Horizontal shoulder abd - GTB - 2x10 Bridge 2x10 LTR x 10  SLR 2x10 each Supine clamshell 2x15 GTB Supine PPT x 10 - 5" hold   Manual Therapy: STM bil UT w/ pin and stretch STM bil cervical paraspinals Sub occipital release Manual cervical traction                     HOME EXERCISE PROGRAM: Access Code: [email or web address]: [email or web address] Date: 05/16/2022 Prepared by: DaOctavio MannsExercises - Supine Lower Trunk Rotation  - 1 x daily - 7 x weekly - 1 sets -  20 reps - 3 hold - Seated Sciatic Tensioner  - 2 x daily - 7 x weekly - 20 reps - Standing Shoulder Row with Anchored Resistance  - 1 x daily - 7 x weekly - 3 sets - 10 reps - green band hold - Supine Chin Tuck  - 1 x daily - 7 x weekly - 2 sets - 10 reps - 3 sec hold   ASTERISK SIGNS     Asterisk Signs Eval (04/07/2022) 8/2  8/29  9/8  9/18     Slump and SLR (+)            Max and average pain 8/10 5/10 pain currently  8/10 currently  6/10 avg  4-5/10     Lumbar flexion Fingertips to knees        Mid shin                                     ASSESSMENT:   CLINICAL IMPRESSION: ***   OBJECTIVE IMPAIRMENTS: Pain, lumbar ROM, cervical rotation ROM, UE strength, LE strength   ACTIVITY LIMITATIONS: bending, lifting, cutting hair   PERSONAL FACTORS: See medical history and pertinent  history       GOALS:     SHORT TERM GOALS: Target date: 04/28/2022   Kelsey Cooper will be >75% HEP compliant to improve carryover between sessions and facilitate independent management of condition   Evaluation (04/07/2022): ongoing Goal status: Met 8/2     LONG TERM GOALS: Target date: 06/02/2022 (extended to 10/3)   Kelsey Cooper will show a >/= 12 pt improvement in their ODI score (MCID is 12 pts) as a proxy for functional improvement    Evaluation/Baseline (04/07/2022): Modified Oswestry Low Back Pain Disability Questionnaire: 16 / 50 = 32.0 % 05/16/2022: 32% disability Goal status: ONGOING     2.  Debra will self report >/= 50% decrease in pain from evaluation    Evaluation/Baseline (04/07/2022): 8/10 max pain 05/16/2022: 8/10 pain Goal status: ONGOING     3.  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  Kailynne will be able to lift 25 lbs from the floor and place on a 3 foot counter, not limited by pain    Evaluation/Baseline (04/07/2022): limited by pain 05/16/2022: continued limitation secondary to pain Goal status: ONGOING   5.  Babita will demonstrate >130 degrees of active ROM in flexion to allow completion of activities involving reaching [place], not limited by pain   Evaluation/Baseline (04/07/2022): 90 degrees 05/16/2022: 105 degrees Goal status: ONGOING         PLAN: PT FREQUENCY: 1x/week   PT DURATION: 5 weeks (Ending 06/20/2022)   PLANNED INTERVENTIONS: Therapeutic exercises, Aquatic therapy, Therapeutic activity, Neuro Muscular re-education, Gait training, Patient/Family education, Joint mobilization, Dry Needling, Electrical stimulation, Spinal mobilization and/or manipulation, Moist heat, Taping, Vasopneumatic device, Ionotophoresis 35m/ml Dexamethasone, and Manual therapy   PLAN FOR NEXT SESSION: progressive hip and core strengthening,  neural stretching (LE), MT prn   DWard ChattersPT 06/26/2022, 1:44 PM

## 2022-06-27 ENCOUNTER — Ambulatory Visit: Payer: Medicaid Other | Attending: Internal Medicine

## 2022-06-27 DIAGNOSIS — M6281 Muscle weakness (generalized): Secondary | ICD-10-CM | POA: Diagnosis present

## 2022-06-27 DIAGNOSIS — M545 Low back pain, unspecified: Secondary | ICD-10-CM | POA: Insufficient documentation

## 2022-06-27 DIAGNOSIS — M542 Cervicalgia: Secondary | ICD-10-CM | POA: Diagnosis present

## 2022-07-31 ENCOUNTER — Ambulatory Visit: Payer: Medicaid Other | Attending: Internal Medicine

## 2022-07-31 DIAGNOSIS — M6281 Muscle weakness (generalized): Secondary | ICD-10-CM | POA: Diagnosis present

## 2022-07-31 DIAGNOSIS — M545 Low back pain, unspecified: Secondary | ICD-10-CM | POA: Insufficient documentation

## 2022-07-31 DIAGNOSIS — M542 Cervicalgia: Secondary | ICD-10-CM | POA: Insufficient documentation

## 2022-07-31 NOTE — Therapy (Signed)
OUTPATIENT PHYSICAL THERAPY TREATMENT NOTE/DISCHARGE  PHYSICAL THERAPY DISCHARGE SUMMARY  Visits from Start of Care: 8  Current functional level related to goals / functional outcomes: See goals and objective   Remaining deficits: See goals and objective   Education / Equipment: HEP   Patient agrees to discharge. Patient goals were not met. Patient is being discharged due to maximized rehab potential.    Patient Name: Kelsey Cooper MRN: [id number] DOB:03/19/1991, 31 y.o., female Today's Date: 07/31/2022  PCP: Trey Sailors, PA   REFERRING PROVIDER: Benito Mccreedy, MD   PT End of Session - 07/31/22 0830     Visit Number 8    Number of Visits 8    Date for PT Re-Evaluation 08/01/22    Authorization Type [place] [facility] - ODI    PT Start Time 0830    PT Stop Time 0908    PT Time Calculation (min) 38 min    Activity Tolerance Patient tolerated treatment well    Behavior During Therapy WFL for tasks assessed/performed                 Past Medical History:  Diagnosis Date   Anxiety    GERD (gastroesophageal reflux disease)    Severe depression ([place])    Past Surgical History:  Procedure Laterality Date   NO PAST SURGERIES     Patient Active Problem List   Diagnosis Date Noted   [redacted] weeks gestation of pregnancy 04/27/2017    THERAPY DIAG:  Low back pain, unspecified back pain laterality, unspecified chronicity, unspecified whether sciatica present  Cervicalgia  Muscle weakness  REFERRING DIAG: Myalgia, other site [M79.18], Other muscle spasm [M62.838]  PERTINENT HISTORY: None  PRECAUTIONS/RESTRICTIONS: None  SUBJECTIVE:  Pt presents to PT with reports of continued severe neck pain and discomfort. 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 She has an upcoming follow up with referring provider. Feels like her lower back has gotten much better, but has continued neck pain and feels discouraged by this. Is ready to begin PT at  this time.  Pain:  Are you having pain? Yes Pain location: neck pain NPRS scale:  Current 7/10  Worst: 10/10  Aggravating factors: standing up for long periods, bending, doing hair Relieving factors: minimal relieving symptoms Pain description: constant, dull, and aching Stage: Chronic 24 hour pattern: better in morning   OBJECTIVE:  GENERAL OBSERVATION:          Forward head                               SENSATION:          Light touch: Appears intact           PALPATION: TTP bil lumbar paraspinals   Cervical ROM   ROM ROM  04/07/2022  Flexion N*  Extension N*  Right lateral flexion N*  Left lateral flexion N*  Right rotation 45*  Left rotation 50*  Flexion rotation (normal is 30 degrees)    Flexion rotation (normal is 30 degrees)      (Blank rows = not tested, N = WNL, * = concordant pain)   LUMBAR AROM   AROM AROM  04/07/2022  Flexion limited by 50%, w/ concordant pain  Extension limited by 50%, w/ concordant pain  Right lateral flexion limited by 50%, w/ concordant pain  Left lateral flexion limited by 50%, w/ concordant pain  Right rotation limited by  25%, w/ concordant pain  Left rotation limited by 50%, w/ concordant pain    (Blank rows = not tested)      UPPER EXTREMITY MMT:   MMT Right 04/07/2022 Left 04/07/2022  Shoulder flexion 3+* 3+*  Shoulder abduction (C5) 3+* 3+*  Shoulder ER      Shoulder IR      Middle trapezius      Lower trapezius      Shoulder extension      Grip strength      Cervical flexion (C1,C2)      Cervical S/B (C3)      Shoulder shrug (C4)      Elbow flexion (C6)      Elbow ext (C7)      Thumb ext (C8)      Finger abd (T1)      Grossly        (Blank rows = not tested, score listed is out of 5 possible points.  N = WNL, D = diminished, C = clear for gross weakness with myotome testing, * = concordant pain with testing)   UPPER EXTREMITY AROM:   ROM Right 04/07/2022 Left 04/07/2022 Right 05/16/2022 Left 05/16/2022  Right 06/27/2022 Left 06/27/2022 Right 07/31/2022 Left 07/31/2022  Shoulder flexion 90* 90* 105* 105* 115* 115* 140 140  Shoulder abduction 90* 90* 105* 105* 115* 115* 140 140  Shoulder internal rotation            Shoulder external rotation            Functional IR            Functional ER            Shoulder extension            Elbow extension            Elbow flexion              (Blank rows = not tested, N = WNL, * = concordant pain with testing)   LE MMT:   MMT Right 04/07/2022 Left 04/07/2022  Hip flexion (L2, L3)      Knee extension (L3)      Knee flexion      Hip abduction      Hip extension      Hip external rotation      Hip internal rotation      Hip adduction      Ankle dorsiflexion (L4)      Ankle plantarflexion (S1)      Ankle inversion      Ankle eversion      Great Toe ext (L5)      Grossly        (Blank rows = not tested, score listed is out of 5 possible points.  N = WNL, D = diminished, C = clear for gross weakness with myotome testing, * = concordant pain with testing)   SPECIAL TESTS:           SLR (+) R           Slump (+) R     PATIENT SURVEYS:  ODI: 62% disability - 07/31/2022              TODAY'S TREATMENT:  [facility] PT Treatment:  DATE: 07/31/2022 Therapeutic Activity: Assessment of tests/measure, goals, and outcomes for progress note and recert Therapeutic Exercise: Seated bilateral ER GTB 2x10 Supine Horizontal shoulder abd - GTB - 2x10 Bridge 2x10 Supine PPT x 10 - 5" hold Supine PPT with ball 2x10 - 5" hold   LTR x 10  SLR 2x10 each Supine clamshell 2x15 GTB               HOME EXERCISE PROGRAM: Access Code: [email or web address] URL: [email or web address] Date: 05/16/2022 Prepared by: Octavio Manns  Exercises - Supine Lower Trunk Rotation  - 1 x daily - 7 x weekly - 1 sets - 20 reps - 3 hold - Seated Sciatic Tensioner  - 2 x daily - 7 x weekly - 20 reps - Standing  Shoulder Row with Anchored Resistance  - 1 x daily - 7 x weekly - 3 sets - 10 reps - green band hold - Supine Chin Tuck  - 1 x daily - 7 x weekly - 2 sets - 10 reps - 3 sec hold   ASTERISK SIGNS     Asterisk Signs Eval (04/07/2022) 8/2  8/29  9/8  9/18     Slump and SLR (+)            Max and average pain 8/10 5/10 pain currently  8/10 currently  6/10 avg  4-5/10     Lumbar flexion Fingertips to knees        Mid shin                                     ASSESSMENT:   CLINICAL IMPRESSION: Pt demonstrated knowledge of HEP with no adverse effect or increase in pain. Over the course of PT treatment she made good initial progress towards decreasing neck and LBP and still has decline in LBP overall. Unfortunately she is limited by continued severe neck pain. After discussion, she will see PCP for follow up and discharge from therapy at this time due to lack of progress for time being. Pt in agreement with current plan, ready to d/c at this time.    OBJECTIVE IMPAIRMENTS: Pain, lumbar ROM, cervical rotation ROM, UE strength, LE strength   ACTIVITY LIMITATIONS: bending, lifting, cutting hair   PERSONAL FACTORS: See medical history and pertinent history       GOALS:     SHORT TERM GOALS: Target date: 04/28/2022   Syan will be >75% HEP compliant to improve carryover between sessions and facilitate independent management of condition   Evaluation (04/07/2022): ongoing Goal status: Met 8/2     LONG TERM GOALS: Target date: extended 08/01/2022   Keryl will show a >/= 12 pt improvement in their ODI score (MCID is 12 pts) as a proxy for functional improvement    Evaluation/Baseline (04/07/2022): Modified Oswestry Low Back Pain Disability Questionnaire: 16 / 50 = 32.0 % 05/16/2022: 32% disability 06/27/2022: 61% disability 07/31/2022: 61% disability Goal status: NOT MET     2.  Arayna will self report >/= 50% decrease in pain from evaluation    Evaluation/Baseline (04/07/2022): 8/10 max  pain 05/16/2022: 8/10 pain 06/27/2022: 6/10 pain 07/31/2022: 10/10 at worst Goal status: NOT MET     3.  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  Yaretsi will  be able to lift 25 lbs from the floor and place on a 3 foot counter, not limited by pain    Evaluation/Baseline (04/07/2022): limited by pain 05/16/2022: continued limitation secondary to pain Goal status: NOT MET   5.  Scotti will demonstrate >130 degrees of active ROM in flexion to allow completion of activities involving reaching [place], not limited by pain   Evaluation/Baseline (04/07/2022): 90 degrees 05/16/2022: 105 degrees 07/31/2022: 140 Goal status: MET         PLAN: PT FREQUENCY: 1x/week   PT DURATION: 5 weeks (Ending 08/01/2022)   PLANNED INTERVENTIONS: Therapeutic exercises, Aquatic therapy, Therapeutic activity, Neuro Muscular re-education, Gait training, Patient/Family education, Joint mobilization, Dry Needling, Electrical stimulation, Spinal mobilization and/or manipulation, Moist heat, Taping, Vasopneumatic device, Ionotophoresis 76m/ml Dexamethasone, and Manual therapy   PLAN FOR NEXT SESSION: progressive hip and core strengthening, neural stretching (LE), MT prn   DWard ChattersPT 07/31/2022, 9:10 AM

## 2022-08-01 ENCOUNTER — Encounter: Payer: Self-pay | Admitting: Obstetrics and Gynecology

## 2022-08-01 ENCOUNTER — Ambulatory Visit: Payer: Medicaid Other | Admitting: Obstetrics and Gynecology

## 2022-08-02 NOTE — Progress Notes (Signed)
Pt not seen for appointment

## 2022-08-22 ENCOUNTER — Encounter: Payer: Self-pay | Admitting: Physical Therapy

## 2022-08-22 ENCOUNTER — Other Ambulatory Visit: Payer: Self-pay

## 2022-08-22 ENCOUNTER — Ambulatory Visit: Payer: Medicaid Other | Attending: Internal Medicine | Admitting: Physical Therapy

## 2022-08-22 DIAGNOSIS — M545 Low back pain, unspecified: Secondary | ICD-10-CM | POA: Insufficient documentation

## 2022-08-22 DIAGNOSIS — M542 Cervicalgia: Secondary | ICD-10-CM | POA: Diagnosis present

## 2022-08-22 DIAGNOSIS — M6281 Muscle weakness (generalized): Secondary | ICD-10-CM | POA: Insufficient documentation
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
## 2022-08-30 ENCOUNTER — Encounter (HOSPITAL_COMMUNITY): Payer: Self-pay

## 2022-08-30 ENCOUNTER — Ambulatory Visit (HOSPITAL_COMMUNITY)
Admission: EM | Admit: 2022-08-30 | Discharge: 2022-08-30 | Disposition: A | Discharge status: Home / Self Care | Payer: Medicaid Other

## 2022-08-30 DIAGNOSIS — R21 Rash and other nonspecific skin eruption: Secondary | ICD-10-CM | POA: Diagnosis not present

## 2022-08-30 MED ORDER — PREDNISONE 10 MG (21) PO TBPK: ORAL_TABLET | Freq: Every day | ORAL | 0 refills | Status: AC

## 2022-08-30 MED ORDER — PREDNISONE 20 MG PO TABS
40.0000 mg | ORAL_TABLET | Freq: Once | ORAL | Status: AC
  Administered 2022-08-30: 40 mg via ORAL

## 2022-08-30 MED ORDER — PREDNISONE 20 MG PO TABS
ORAL_TABLET | ORAL | Status: AC
  Filled 2022-08-30: qty 2

## 2022-08-30 NOTE — ED Triage Notes (Signed)
Pt is here for a rash on face pt, was seen at her primary care and was prescribed a med for face but, pt lost r/x

## 2022-08-30 NOTE — Discharge Instructions (Signed)
Today you are being treated for your facial rash  On exam appears to be irritation to the skin, currently no signs of infection  You have been given a dose of steroids here tonight in office, steroids help to reduce inflammation and irritation that occurs with reactions and will start the process to be clear your rash  Starting tomorrow take prednisone course as directed to continue the above process  You may cleanse face with water and a nonscented soap, pat dry, moisturize with a nonscented product, may use hydrocortisone over the skin as needed for comfort  You may follow-up with his urgent care or your primary doctor if your symptoms continue to persist or worsen

## 2022-08-30 NOTE — ED Provider Notes (Signed)
[facility]    CSN: [id number] Arrival date & time: 08/30/22  1945      History   Chief Complaint Chief Complaint  Patient presents with   Rash    HPI Kelsey Cooper is a 31 y.o. female.   Presents for evaluation of a rash following the jawline beginning 7 days ago.  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  Was evaluated by her PCP and given prednisone but lost prescription.  Endorses symptoms are flaring.  Denies pruritus, drainage, fevers.  Has attempted use of a over-the-counter cleanser but ineffective.    Past Medical History:  Diagnosis Date   Anxiety    GERD (gastroesophageal reflux disease)    Severe depression ([facility])     Patient Active Problem List   Diagnosis Date Noted   [redacted] weeks gestation of pregnancy 04/27/2017    Past Surgical History:  Procedure Laterality Date   NO PAST SURGERIES      OB History     Gravida  5   Para  3   Term  3   Preterm  0   AB  2   Living  3      SAB  1   IAB  1   Ectopic      Multiple  0   Live Births  3            Home Medications    Prior to Admission medications   Medication Sig Start Date End Date Taking? Authorizing Provider  BENADRYL ITCH RELIEF STICK 2-0.1 % STCK Apply topically. 08/23/22  Yes [provider]  predniSONE (DELTASONE) 20 MG tablet Take 20 mg by mouth daily. 08/22/22  Yes [provider]  ibuprofen (ADVIL) 600 MG tablet Take 1 tablet (600 mg total) by mouth every 6 (six) hours as needed. Patient not taking: Reported on 08/01/2022 05/03/20   Moshe Cipro, NP  levonorgestrel (MIRENA, 52 MG,) 20 MCG/DAY IUD Mirena 20 mcg/24 hours (8 yrs) 52 mg intrauterine device  Take 1 device by intrauterine route.    [provider]  mupirocin ointment (BACTROBAN) 2 % Apply 1 application topically daily. Patient not taking: Reported on 08/01/2022 09/12/21   Raspet, Noberto Retort, PA-C    Family History Family History   Problem Relation Age of Onset   Diabetes Father    Hypertension Paternal Grandmother     Social History Social History   Tobacco Use   Smoking status: Former    Packs/day: 1.00    Types: Cigarettes    Quit date: 2020    Years since quitting: 3.9   Smokeless tobacco: Former  Building services engineer Use: Every day  Substance Use Topics   Alcohol use: No   Drug use: No     Allergies   Patient has no known allergies.   Review of Systems Review of Systems  Skin:  Positive for rash.     Physical Exam Triage Vital Signs ED Triage Vitals  Enc Vitals Group     BP 08/30/22 2008 124/88     Pulse --      Resp 08/30/22 2008 16     Temp 08/30/22 2008 98.2 F (36.8 C)     Temp Source 08/30/22 2008 Oral     SpO2 08/30/22 2008 98 %     Weight --      Height --      Head Circumference --  Peak Flow --      Pain Score 08/30/22 2006 0     Pain Loc --      Pain Edu? --      Excl. in GC? --    No data found.  Updated Vital Signs BP 124/88 (BP Location: Right Arm)   Temp 98.2 F (36.8 C) (Oral)   Resp 16   LMP  (LMP Unknown)   SpO2 98%   Visual Acuity Right Eye Distance:   Left Eye Distance:   Bilateral Distance:    Right Eye Near:   Left Eye Near:    Bilateral Near:     Physical Exam Constitutional:      Appearance: Normal appearance.  Eyes:     Extraocular Movements: Extraocular movements intact.  Pulmonary:     Effort: Pulmonary effort is normal.  Skin:    Comments: Flesh tone papular lesions present along the bilateral lower cheeks, nondraining, nontender  Neurological:     Mental Status: She is alert and oriented to person, place, and time. Mental status is at baseline.  Psychiatric:        Mood and Affect: Mood normal.        Behavior: Behavior normal.      UC Treatments / Results  Labs (all labs ordered are listed, but only abnormal results are displayed) Labs Reviewed - No data to display  EKG   Radiology No results  found.  Procedures Procedures (including critical care time)  Medications Ordered in UC Medications - No data to display  Initial Impression / Assessment and Plan / UC Course  I have reviewed the triage vital signs and the nursing notes.  Pertinent labs & imaging results that were available during my care of the patient were reviewed by me and considered in my medical decision making (see chart for details).  Rash  Unknown etiology, appears to be a contact dermatitis, no signs of infection at this time, discussed with patient, prednisone given in office per patient request and prednisone course prescribed for outpatient use, may cleanse with diluted soapy water, and use nonscented products to the skin to prevent further irritation, may use hydrocortisone over the skin as needed, may follow-up with her primary doctor for reevaluation as needed Final Clinical Impressions(s) / UC Diagnoses   Final diagnoses:  None   Discharge Instructions   None    ED Prescriptions   None    PDMP not reviewed this encounter.   Valinda Hoar, [place] 08/31/22 [phone]
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
## 2023-11-14 ENCOUNTER — Other Ambulatory Visit: Payer: Self-pay

## 2023-11-14 ENCOUNTER — Ambulatory Visit: Payer: MEDICAID | Attending: Internal Medicine

## 2023-11-14 DIAGNOSIS — R252 Cramp and spasm: Secondary | ICD-10-CM | POA: Diagnosis present

## 2023-11-14 DIAGNOSIS — M5459 Other low back pain: Secondary | ICD-10-CM | POA: Diagnosis present

## 2023-11-21 ENCOUNTER — Ambulatory Visit: Payer: MEDICAID | Attending: Internal Medicine

## 2023-11-21 DIAGNOSIS — M6281 Muscle weakness (generalized): Secondary | ICD-10-CM | POA: Diagnosis present

## 2023-11-21 DIAGNOSIS — R293 Abnormal posture: Secondary | ICD-10-CM | POA: Diagnosis present

## 2023-11-21 DIAGNOSIS — M542 Cervicalgia: Secondary | ICD-10-CM | POA: Insufficient documentation

## 2023-11-21 DIAGNOSIS — R252 Cramp and spasm: Secondary | ICD-10-CM | POA: Insufficient documentation

## 2023-11-21 DIAGNOSIS — M5459 Other low back pain: Secondary | ICD-10-CM | POA: Diagnosis present

## 2023-11-21 DIAGNOSIS — M545 Low back pain, unspecified: Secondary | ICD-10-CM | POA: Insufficient documentation
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
## 2023-11-29 ENCOUNTER — Ambulatory Visit: Payer: MEDICAID

## 2023-12-13 ENCOUNTER — Ambulatory Visit: Payer: MEDICAID

## 2023-12-13 DIAGNOSIS — M5459 Other low back pain: Secondary | ICD-10-CM | POA: Diagnosis not present

## 2023-12-13 DIAGNOSIS — R252 Cramp and spasm: Secondary | ICD-10-CM

## 2023-12-13 DIAGNOSIS — R293 Abnormal posture: Secondary | ICD-10-CM

## 2023-12-13 DIAGNOSIS — M542 Cervicalgia: Secondary | ICD-10-CM
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
## 2024-01-09 ENCOUNTER — Ambulatory Visit: Payer: MEDICAID | Attending: Internal Medicine

## 2024-01-09 DIAGNOSIS — M5459 Other low back pain: Secondary | ICD-10-CM | POA: Insufficient documentation

## 2024-01-09 DIAGNOSIS — R252 Cramp and spasm: Secondary | ICD-10-CM | POA: Insufficient documentation

## 2024-01-09 DIAGNOSIS — M542 Cervicalgia: Secondary | ICD-10-CM | POA: Insufficient documentation

## 2024-01-09 DIAGNOSIS — M6281 Muscle weakness (generalized): Secondary | ICD-10-CM | POA: Insufficient documentation

## 2024-01-10 ENCOUNTER — Ambulatory Visit: Payer: MEDICAID

## 2024-01-10 DIAGNOSIS — M542 Cervicalgia: Secondary | ICD-10-CM | POA: Diagnosis present

## 2024-01-10 DIAGNOSIS — M6281 Muscle weakness (generalized): Secondary | ICD-10-CM | POA: Diagnosis present

## 2024-01-10 DIAGNOSIS — M5459 Other low back pain: Secondary | ICD-10-CM | POA: Diagnosis present

## 2024-01-10 DIAGNOSIS — R252 Cramp and spasm: Secondary | ICD-10-CM | POA: Diagnosis present
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
## 2024-01-17 ENCOUNTER — Ambulatory Visit: Payer: MEDICAID | Attending: Physician Assistant

## 2024-01-18 ENCOUNTER — Telehealth: Payer: Self-pay

## 2024-01-18 NOTE — Telephone Encounter (Signed)
 Spoke to ptre: 01/17/24 no show appt. Pt was advised she didn't have any appts scheduled, and to call the office to schedule her next one. Advised pt of attendance policy.

## 2024-01-23 ENCOUNTER — Telehealth: Payer: Self-pay | Admitting: Physical Therapy

## 2024-01-23 ENCOUNTER — Ambulatory Visit: Payer: MEDICAID | Admitting: Physical Therapy

## 2024-01-23 DIAGNOSIS — F17200 Nicotine dependence, unspecified, uncomplicated: Secondary | ICD-10-CM | POA: Insufficient documentation

## 2024-01-23 NOTE — Telephone Encounter (Signed)
 Called to inform patient of missed visit but VM is not set up - 2 n/s - may only schedule 1x at a time.

## 2024-01-24 DIAGNOSIS — O99019 Anemia complicating pregnancy, unspecified trimester: Secondary | ICD-10-CM | POA: Insufficient documentation

## 2024-02-12 ENCOUNTER — Ambulatory Visit: Payer: MEDICAID | Admitting: Physical Therapy

## 2024-04-09 DIAGNOSIS — Z975 Presence of (intrauterine) contraceptive device: Secondary | ICD-10-CM | POA: Insufficient documentation

## 2024-04-09 DIAGNOSIS — Z0000 Encounter for general adult medical examination without abnormal findings: Secondary | ICD-10-CM | POA: Insufficient documentation

## 2024-04-09 DIAGNOSIS — L905 Scar conditions and fibrosis of skin: Secondary | ICD-10-CM | POA: Insufficient documentation

## 2024-04-09 DIAGNOSIS — F17200 Nicotine dependence, unspecified, uncomplicated: Secondary | ICD-10-CM | POA: Insufficient documentation

## 2024-04-09 DIAGNOSIS — G8929 Other chronic pain: Secondary | ICD-10-CM | POA: Insufficient documentation

## 2024-04-09 DIAGNOSIS — F418 Other specified anxiety disorders: Secondary | ICD-10-CM | POA: Insufficient documentation

## 2024-07-08 ENCOUNTER — Ambulatory Visit: Payer: MEDICAID | Attending: Physician Assistant

## 2024-07-08 ENCOUNTER — Other Ambulatory Visit: Payer: Self-pay

## 2024-07-08 DIAGNOSIS — R293 Abnormal posture: Secondary | ICD-10-CM | POA: Insufficient documentation

## 2024-07-08 DIAGNOSIS — M6281 Muscle weakness (generalized): Secondary | ICD-10-CM | POA: Diagnosis present

## 2024-07-08 DIAGNOSIS — M545 Low back pain, unspecified: Secondary | ICD-10-CM | POA: Diagnosis present

## 2024-07-08 DIAGNOSIS — M542 Cervicalgia: Secondary | ICD-10-CM | POA: Insufficient documentation

## 2024-07-08 NOTE — Therapy (Addendum)
 OUTPATIENT PHYSICAL THERAPY CERVICAL EVALUATION   Patient Name: Kelsey Cooper MRN: [id number] DOB:[birth year], 33 y.o., female Today's Date: 07/08/2024  END OF SESSION:  PT End of Session - 07/08/24 1606     Visit Number 1    Number of Visits 16    Date for Recertification  09/07/24    Authorization Type [employer]    PT Start Time 1100    PT Stop Time 1145    PT Time Calculation (min) 45 min    Activity Tolerance Patient tolerated treatment well          Past Medical History:  Diagnosis Date   Anxiety    GERD (gastroesophageal reflux disease)    Severe depression (HCC)    Past Surgical History:  Procedure Laterality Date   NO PAST SURGERIES     Patient Active Problem List   Diagnosis Date Noted   [redacted] weeks gestation of pregnancy 04/27/2017    PCP: Rosalea Rosina SAILOR, PA PCP - General   REFERRING PROVIDER: Theo Eleanor PARAS, PA-C   REFERRING DIAG: M54.2 (ICD-10-CM) - Cervicalgia   THERAPY DIAG:  Cervicalgia  Muscle weakness  Rationale for Evaluation and Treatment: Rehabilitation  ONSET DATE: chronic  SUBJECTIVE:                                                                                                                                                                                                         SUBJECTIVE STATEMENT: Pain was getting better, but had to stop therapy. 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   PERTINENT HISTORY:  Been to therapy before, had issues with therapy auth and required new referral.  PAIN:  6C, 10W Are you having pain? Yes: NPRS scale: 10 Pain location: Upper neck, spine Pain description: dull Aggravating factors: overwork, driving for long times Relieving factors: rest  PRECAUTIONS: None  RED FLAGS: None     WEIGHT BEARING RESTRICTIONS: No  FALLS:  Has patient fallen in last 6 months? No   OCCUPATION: cleaner  PLOF: Independent  PATIENT GOALS:  ease pain, become independent with HEP    OBJECTIVE:  Note: Objective measures were completed at Evaluation unless otherwise noted.    PATIENT SURVEYS:  NDI:  NECK DISABILITY INDEX  Date: 10/21 Score  Pain intensity   2. Personal care (washing, dressing, etc.)   3. Lifting   4. Reading   5. Headaches   6. Concentration   7. Work   8. Driving   9. Sleeping   10. Recreation  Total 17/50   Minimum Detectable Change (90% confidence): 5 points or 10% points  COGNITION: Overall cognitive status: Within functional limits for tasks assessed  SENSATION: WFL  POSTURE: rounded shoulders and forward head  PALPATION: TTP Cspine erectors, BL UT and MT   CERVICAL ROM:   Active ROM A/PROM (deg) eval  Flexion WFL all p! (Tight)  Extension   Right lateral flexion   Left lateral flexion   Right rotation   Left rotation    (Blank rows = not tested)  UPPER EXTREMITY ROM:  Active ROM Right eval Left eval  Shoulder flexion WFL R shoulder functional movements WFL L shoulder functional movements  Shoulder extension    Shoulder abduction    Shoulder adduction    Shoulder extension    Shoulder internal rotation    Shoulder external rotation    Elbow flexion    Elbow extension    Wrist flexion    Wrist extension    Wrist ulnar deviation    Wrist radial deviation    Wrist pronation    Wrist supination     (Blank rows = not tested)  UPPER EXTREMITY MMT:  MMT Right eval Left eval  Shoulder flexion 4- 4-  Shoulder extension    Shoulder abduction 4- 4-  Shoulder adduction    Shoulder extension    Shoulder internal rotation    Shoulder external rotation    upper trapezius 4 4  Lower trapezius    Elbow flexion    Elbow extension    Wrist flexion    Wrist extension    Wrist ulnar deviation    Wrist radial deviation    Wrist pronation    Wrist supination    Grip strength     (Blank rows = not tested)  CERVICAL SPECIAL TESTS:  Spurling's test:  Negative    TREATMENT DATE:                                                                                                                                TREATMENT 07/08/2024:  Therapeutic Exercise: Supine chin tuck 2x8x3s Supine cervical rotation 2x8x3s Seated scapular retraction 2x8x3s  Self-care/Home Management: Patient educated on HEP, POC, prognosis, and relevant tissues/anatomy.      PATIENT EDUCATION:  Education details: HEP Person educated: Patient Education method: Programmer, multimedia, Facilities manager, Actor cues, Verbal cues, and Handouts Education comprehension: verbalized understanding and returned demonstration  HOME EXERCISE PROGRAM: Access Code: [email or web address] URL: [email or web address] Date: 07/08/2024 Prepared by: [name]  Exercises - Supine Cervical Rotation AROM on Pillow  - 2 x daily - 5 x weekly - 2 sets - 8 reps - 3 hold - Supine Chin Tuck  - 2 x daily - 5 x weekly - 2 sets - 8 reps - 3 hold - Seated Scapular Retraction  - 2 x daily - 5 x weekly - 2 sets - 8 reps - 3 hold  ASSESSMENT:  CLINICAL IMPRESSION: Patient is a  33 year old female who presents with chronic uppre back pain. Patient presents with deficits in: strength, activity tolerance, and excessive pain. As a result, the patient would benefit from skilled PT to address aforementioned deficits via plan below.   OBJECTIVE IMPAIRMENTS: decreased strength, postural dysfunction, and pain.      PERSONAL FACTORS: Profession and Time since onset of injury/illness/exacerbation are also affecting patient's functional outcome.   REHAB POTENTIAL: Good  CLINICAL DECISION MAKING: Stable/uncomplicated  EVALUATION COMPLEXITY: Moderate   GOALS: Goals reviewed with patient? No  SHORT TERM GOALS: Target date: 07/29/2024    Patient will demonstrate 75% HEP compliance to show independence with self-management of condition  Baseline: 0% Goal status: INITIAL  2.  Patient will decrease  worst pain to 8/10 at most to improve ADL completion and overall QOL  Baseline: 10/10 Goal status: INITIAL    LONG TERM GOALS: Target date: 09/07/2024    Patient will demonstrate a 9 point improvement in NDI to show improvements in ADL completion and overall QOL   Baseline: 17/50 Goal status: INITIAL  2.  Patient will decrease worst pain to 4/10 at most to improve ADL completion and overall QOL  Baseline: 10/10 Goal status: INITIAL  3.  Patient will demonstrate 100% HEP compliance to show independence with self-management of condition  Baseline: 0% Goal status: INITIAL    PLAN:  PT FREQUENCY: 1-2x/week  PT DURATION: 8 weeks  PLANNED INTERVENTIONS: 97110-Therapeutic exercises, 97530- Therapeutic activity, 97112- Neuromuscular re-education, 97535- Self Care, [id number]- Manual therapy, and Patient/Family education  PLAN FOR NEXT SESSION: HEP assessment and progression, symptom modulation, and loading (isolated and/or functional). Manual therapy, aerobic, gait, and NME training as needed.     Washington Odessia Scot  PT, DPT   For all possible CPT codes, reference the Planned Interventions line above.     Check all conditions that are expected to impact treatment: {Conditions expected to impact treatment:None of these apply   If treatment provided at initial evaluation, no treatment charged due to lack of authorization.

## 2024-07-15 ENCOUNTER — Ambulatory Visit

## 2024-07-15 DIAGNOSIS — M542 Cervicalgia: Secondary | ICD-10-CM | POA: Diagnosis not present

## 2024-07-15 DIAGNOSIS — M6281 Muscle weakness (generalized): Secondary | ICD-10-CM

## 2024-07-15 NOTE — Therapy (Signed)
 OUTPATIENT PHYSICAL THERAPY TREATMENT NOTE   Patient Name: Kelsey Cooper MRN: [id number] DOB:1991/05/15, 33 y.o., female Today's Date: 07/15/2024  END OF SESSION:  PT End of Session - 07/15/24 1218     Visit Number 2    Number of Visits 16    Date for Recertification  09/07/24    Authorization Type HE[employer] BLUE    Authorization Time Period 5 visits approved 07/08/24-09/05/24    Authorization - Visit Number 1    Authorization - Number of Visits 5    PT Start Time 1218    PT Stop Time 1256    PT Time Calculation (min) 38 min    Activity Tolerance Patient tolerated treatment well    Behavior During Therapy WFL for tasks assessed/performed           Past Medical History:  Diagnosis Date   Anxiety    GERD (gastroesophageal reflux disease)    Severe depression (HCC)    Past Surgical History:  Procedure Laterality Date   NO PAST SURGERIES     Patient Active Problem List   Diagnosis Date Noted   [redacted] weeks gestation of pregnancy 04/27/2017    PCP: Rosalea Rosina SAILOR, PA PCP - General   REFERRING PROVIDER: Theo Eleanor PARAS, PA-C   REFERRING DIAG: M54.2 (ICD-10-CM) - Cervicalgia   THERAPY DIAG:  Cervicalgia  Muscle weakness  Rationale for Evaluation and Treatment: Rehabilitation  ONSET DATE: chronic  SUBJECTIVE:                                                                                                                                                                                                         SUBJECTIVE STATEMENT: Patient reports that her neck and upper back are feeling more tight today, has been compliant with HEP.  EVAL: Pain was getting better, but had to stop therapy. 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   PERTINENT HISTORY:  Been to therapy before, had issues with therapy auth and required new referral.  PAIN:  6C, 10W Are you having pain? Yes: NPRS scale: 10 Pain location: Upper  neck, spine Pain description: dull Aggravating factors: overwork, driving for long times Relieving factors: rest  PRECAUTIONS: None  RED FLAGS: None     WEIGHT BEARING RESTRICTIONS: No  FALLS:  Has patient fallen in last 6 months? No   OCCUPATION: cleaner  PLOF: Independent  PATIENT GOALS: ease pain, become independent with HEP    OBJECTIVE:  Note: Objective measures were completed at Evaluation unless otherwise noted.  PATIENT SURVEYS:  NDI:  NECK DISABILITY INDEX  Date: 10/21 Score  Pain intensity   2. Personal care (washing, dressing, etc.)   3. Lifting   4. Reading   5. Headaches   6. Concentration   7. Work   8. Driving   9. Sleeping   10. Recreation   Total 17/50   Minimum Detectable Change (90% confidence): 5 points or 10% points  COGNITION: Overall cognitive status: Within functional limits for tasks assessed  SENSATION: WFL  POSTURE: rounded shoulders and forward head  PALPATION: TTP Cspine erectors, BL UT and MT   CERVICAL ROM:   Active ROM A/PROM (deg) eval  Flexion WFL all p! (Tight)  Extension   Right lateral flexion   Left lateral flexion   Right rotation   Left rotation    (Blank rows = not tested)  UPPER EXTREMITY ROM:  Active ROM Right eval Left eval  Shoulder flexion WFL R shoulder functional movements WFL L shoulder functional movements  Shoulder extension    Shoulder abduction    Shoulder adduction    Shoulder extension    Shoulder internal rotation    Shoulder external rotation    Elbow flexion    Elbow extension    Wrist flexion    Wrist extension    Wrist ulnar deviation    Wrist radial deviation    Wrist pronation    Wrist supination     (Blank rows = not tested)  UPPER EXTREMITY MMT:  MMT Right eval Left eval  Shoulder flexion 4- 4-  Shoulder extension    Shoulder abduction 4- 4-  Shoulder adduction    Shoulder extension    Shoulder internal rotation    Shoulder external rotation     upper trapezius 4 4  Lower trapezius    Elbow flexion    Elbow extension    Wrist flexion    Wrist extension    Wrist ulnar deviation    Wrist radial deviation    Wrist pronation    Wrist supination    Grip strength     (Blank rows = not tested)  CERVICAL SPECIAL TESTS:  Spurling's test: Negative    TREATMENT  [facility] Adult PT Treatment:                                                DATE: 07/15/24 Therapeutic Exercise: Shoulder shrugs x10 Shoulder rolls fwd/bwd x10 ea Seated ITYs no weight x10 ea Seated upper stretch 2x30 BIL Neuromuscular re-ed: Seated scapular retraction 2x10 Seated chin tucks 3 hold 2x10 Seated BIL ER with scap retraction RTB 2x10 Seated horizontal abduction RTB 2x10 Self Care: Theracane self instruction, tack and stretch, and MTPR  DATE:  TREATMENT 07/08/2024:  Therapeutic Exercise: Supine chin tuck 2x8x3s Supine cervical rotation 2x8x3s Seated scapular retraction 2x8x3s  Self-care/Home Management: Patient educated on HEP, POC, prognosis, and relevant tissues/anatomy.    PATIENT EDUCATION:  Education details: HEP Person educated: Patient Education method: Programmer, Multimedia, Facilities Manager, Actor cues, Verbal cues, and Handouts Education comprehension: verbalized understanding and returned demonstration  HOME EXERCISE PROGRAM: Access Code: [email or web address] URL: [email or web address] Date: 07/08/2024 Prepared by: [name]  Exercises - Supine Cervical Rotation AROM on Pillow  - 2 x daily - 5 x weekly - 2 sets - 8 reps - 3 hold - Supine Chin Tuck  - 2 x daily - 5 x weekly - 2 sets - 8 reps - 3 hold - Seated Scapular Retraction  - 2 x daily - 5 x weekly - 2 sets - 8 reps - 3 hold  ASSESSMENT:  CLINICAL IMPRESSION: Patient presents to first follow up session reporting continued pain and compliance with her  HEP. Session today focused on gentle mat based periscapular and shoulder strengthening. Utilized theracane for trigger point release. She was easily fatigued with all exercises, needing occasional rest breaks. Patient was able to tolerate all prescribed exercises with no adverse effects. Patient continues to benefit from skilled PT services and should be progressed as able to improve functional independence.   EVAL: Patient is a 33 year old female who presents with chronic uppre back pain. Patient presents with deficits in: strength, activity tolerance, and excessive pain. As a result, the patient would benefit from skilled PT to address aforementioned deficits via plan below.   OBJECTIVE IMPAIRMENTS: decreased strength, postural dysfunction, and pain.    PERSONAL FACTORS: Profession and Time since onset of injury/illness/exacerbation are also affecting patient's functional outcome.   REHAB POTENTIAL: Good  CLINICAL DECISION MAKING: Stable/uncomplicated  EVALUATION COMPLEXITY: Moderate   GOALS: Goals reviewed with patient? No  SHORT TERM GOALS: Target date: 07/29/2024   Patient will demonstrate 75% HEP compliance to show independence with self-management of condition  Baseline: 0% Goal status: INITIAL  2.  Patient will decrease worst pain to 8/10 at most to improve ADL completion and overall QOL  Baseline: 10/10 Goal status: INITIAL    LONG TERM GOALS: Target date: 09/07/2024    Patient will demonstrate a 9 point improvement in NDI to show improvements in ADL completion and overall QOL   Baseline: 17/50 Goal status: INITIAL  2.  Patient will decrease worst pain to 4/10 at most to improve ADL completion and overall QOL  Baseline: 10/10 Goal status: INITIAL  3.  Patient will demonstrate 100% HEP compliance to show independence with self-management of condition  Baseline: 0% Goal status: INITIAL    PLAN:  PT FREQUENCY: 1-2x/week  PT DURATION: 8  weeks  PLANNED INTERVENTIONS: 97110-Therapeutic exercises, 97530- Therapeutic activity, 97112- Neuromuscular re-education, 97535- Self Care, [id number]- Manual therapy, and Patient/Family education  PLAN FOR NEXT SESSION: HEP assessment and progression, symptom modulation, and loading (isolated and/or functional). Manual therapy, aerobic, gait, and NME training as needed.     Corean Pouch PTA  07/15/24 12:59 PM

## 2024-07-18 ENCOUNTER — Ambulatory Visit

## 2024-07-18 DIAGNOSIS — M6281 Muscle weakness (generalized): Secondary | ICD-10-CM

## 2024-07-18 DIAGNOSIS — R293 Abnormal posture: Secondary | ICD-10-CM

## 2024-07-18 DIAGNOSIS — M545 Low back pain, unspecified: Secondary | ICD-10-CM

## 2024-07-18 DIAGNOSIS — M542 Cervicalgia: Secondary | ICD-10-CM

## 2024-07-18 NOTE — Therapy (Signed)
 OUTPATIENT PHYSICAL THERAPY TREATMENT NOTE   Patient Name: Kelsey Cooper MRN: [id number] DOB:01-Jul-1991, 33 y.o., female Today's Date: 07/18/2024  END OF SESSION:  PT End of Session - 07/18/24 1009     Visit Number 3    Number of Visits 16    Date for Recertification  09/07/24    Authorization Type [employer] BLUE    Authorization Time Period 5 visits approved 07/08/24-09/05/24    Authorization - Number of Visits 5    PT Start Time 1010    PT Stop Time 1050    PT Time Calculation (min) 40 min    Activity Tolerance Patient tolerated treatment well    Behavior During Therapy WFL for tasks assessed/performed            Past Medical History:  Diagnosis Date   Anxiety    GERD (gastroesophageal reflux disease)    Severe depression (HCC)    Past Surgical History:  Procedure Laterality Date   NO PAST SURGERIES     Patient Active Problem List   Diagnosis Date Noted   [redacted] weeks gestation of pregnancy 04/27/2017    PCP: Kelsey Rosina SAILOR, PA PCP - General   REFERRING PROVIDER: Theo Eleanor PARAS, PA-C   REFERRING DIAG: M54.2 (ICD-10-CM) - Cervicalgia   THERAPY DIAG:  Cervicalgia  Muscle weakness  Abnormal posture  Low back pain, unspecified back pain laterality, unspecified chronicity, unspecified whether sciatica present  Rationale for Evaluation and Treatment: Rehabilitation  ONSET DATE: chronic  SUBJECTIVE:                                                                                                                                                                                                         SUBJECTIVE STATEMENT:  Reports feeling better each session. Some tightness in back today still. Been compliant with HEP.  EVAL: Pain was getting better, but had to stop therapy. 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   PERTINENT HISTORY:  Been to therapy before, had issues with therapy auth and required new  referral.  PAIN:  4.5C, 10W   Are you having pain? Yes: NPRS scale: 10 Pain location: Upper neck, spine Pain description: dull Aggravating factors: overwork, driving for long times Relieving factors: rest  PRECAUTIONS: None  RED FLAGS: None     WEIGHT BEARING RESTRICTIONS: No  FALLS:  Has patient fallen in last 6 months? No   OCCUPATION: cleaner  PLOF: Independent  PATIENT GOALS: ease pain, become independent with HEP    OBJECTIVE:  Note: Objective measures were completed at Evaluation unless otherwise noted.    PATIENT SURVEYS:  NDI:  NECK DISABILITY INDEX  Date: 10/21 Score  Pain intensity   2. Personal care (washing, dressing, etc.)   3. Lifting   4. Reading   5. Headaches   6. Concentration   7. Work   8. Driving   9. Sleeping   10. Recreation   Total 17/50   Minimum Detectable Change (90% confidence): 5 points or 10% points  COGNITION: Overall cognitive status: Within functional limits for tasks assessed  SENSATION: WFL  POSTURE: rounded shoulders and forward head  PALPATION: TTP Cspine erectors, BL UT and MT   CERVICAL ROM:   Active ROM A/PROM (deg) eval  Flexion WFL all p! (Tight)  Extension   Right lateral flexion   Left lateral flexion   Right rotation   Left rotation    (Blank rows = not tested)  UPPER EXTREMITY ROM:  Active ROM Right eval Left eval  Shoulder flexion WFL R shoulder functional movements WFL L shoulder functional movements  Shoulder extension    Shoulder abduction    Shoulder adduction    Shoulder extension    Shoulder internal rotation    Shoulder external rotation    Elbow flexion    Elbow extension    Wrist flexion    Wrist extension    Wrist ulnar deviation    Wrist radial deviation    Wrist pronation    Wrist supination     (Blank rows = not tested)  UPPER EXTREMITY MMT:  MMT Right eval Left eval  Shoulder flexion 4- 4-  Shoulder extension    Shoulder abduction 4- 4-  Shoulder  adduction    Shoulder extension    Shoulder internal rotation    Shoulder external rotation    upper trapezius 4 4  Lower trapezius    Elbow flexion    Elbow extension    Wrist flexion    Wrist extension    Wrist ulnar deviation    Wrist radial deviation    Wrist pronation    Wrist supination    Grip strength     (Blank rows = not tested)  CERVICAL SPECIAL TESTS:  Spurling's test: Negative    TREATMENT  TREATMENT 07/18/2024:  Therapeutic Exercise: HEP review and update Seated shrug 2x8x3s Seated scap retraction 2x8x3s Seated chin tuck 2x8x3s Seated Y's 2x6 Standing YTB row 2x6x3s Standing pullover YTB 2x4x3s 5lb kb shrugs 8x3s    Manual Therapy: STM BL UT, MT, lat, Thoracolumbar PS        [facility] Adult PT Treatment:                                                DATE: 07/15/24 Therapeutic Exercise: Shoulder shrugs x10 Shoulder rolls fwd/bwd x10 ea Seated ITYs no weight x10 ea Seated upper stretch 2x30 BIL Neuromuscular re-ed: Seated scapular retraction 2x10 Seated chin tucks 3 hold 2x10 Seated BIL ER with scap retraction RTB 2x10 Seated horizontal abduction RTB 2x10 Self Care: Theracane self instruction, tack and stretch, and MTPR  DATE:  TREATMENT 07/08/2024:  Therapeutic Exercise: Supine chin tuck 2x8x3s Supine cervical rotation 2x8x3s Seated scapular retraction 2x8x3s  Self-care/Home Management: Patient educated on HEP, POC, prognosis, and relevant tissues/anatomy.    PATIENT EDUCATION:  Education details: HEP Person educated: Patient Education method: Programmer, Multimedia, Facilities Manager, Actor cues, Verbal cues, and Handouts Education comprehension: verbalized understanding and returned demonstration  HOME EXERCISE PROGRAM: Access Code: [email or web address] URL: [email or web address] Date: 07/08/2024 Prepared by:  [name]  Exercises - Supine Cervical Rotation AROM on Pillow  - 2 x daily - 5 x weekly - 2 sets - 8 reps - 3 hold - Supine Chin Tuck  - 2 x daily - 5 x weekly - 2 sets - 8 reps - 3 hold - Seated Scapular Retraction  - 2 x daily - 5 x weekly - 2 sets - 8 reps - 3 hold  ASSESSMENT:  CLINICAL IMPRESSION: Patient tolerated treatment with no increases in pain with progressions in BL UT, MT, lat, and UE loading. Current deficits include: functional activity tolerance of BL UE/back, strength, and excessive pain. As a result, patient would continue to benefit from skilled PT to address said deficits via plan below.    EVAL: Patient is a 33 year old female who presents with chronic uppre back pain. Patient presents with deficits in: strength, activity tolerance, and excessive pain. As a result, the patient would benefit from skilled PT to address aforementioned deficits via plan below.   OBJECTIVE IMPAIRMENTS: decreased strength, postural dysfunction, and pain.    PERSONAL FACTORS: Profession and Time since onset of injury/illness/exacerbation are also affecting patient's functional outcome.   REHAB POTENTIAL: Good  CLINICAL DECISION MAKING: Stable/uncomplicated  EVALUATION COMPLEXITY: Moderate   GOALS: Goals reviewed with patient? No  SHORT TERM GOALS: Target date: 07/29/2024   Patient will demonstrate 75% HEP compliance to show independence with self-management of condition  Baseline: 0% Goal status: INITIAL  2.  Patient will decrease worst pain to 8/10 at most to improve ADL completion and overall QOL  Baseline: 10/10 Goal status: INITIAL    LONG TERM GOALS: Target date: 09/07/2024    Patient will demonstrate a 9 point improvement in NDI to show improvements in ADL completion and overall QOL   Baseline: 17/50 Goal status: INITIAL  2.  Patient will decrease worst pain to 4/10 at most to improve ADL completion and overall QOL  Baseline: 10/10 Goal status:  INITIAL  3.  Patient will demonstrate 100% HEP compliance to show independence with self-management of condition  Baseline: 0% Goal status: INITIAL    PLAN:  PT FREQUENCY: 1-2x/week  PT DURATION: 8 weeks  PLANNED INTERVENTIONS: 97110-Therapeutic exercises, 97530- Therapeutic activity, 97112- Neuromuscular re-education, 97535- Self Care, [id number]- Manual therapy, and Patient/Family education  PLAN FOR NEXT SESSION: HEP assessment and progression, symptom modulation, and loading (isolated and/or functional). Manual therapy and NME training as needed.     Washington Greener Tephanie Escorcia PT  07/18/24 11:09 AM

## 2024-08-06 ENCOUNTER — Ambulatory Visit: Admission: EM | Admit: 2024-08-06 | Discharge: 2024-08-06 | Disposition: A | Discharge status: Home / Self Care

## 2024-08-06 ENCOUNTER — Encounter: Payer: Self-pay | Admitting: Emergency Medicine

## 2024-08-06 DIAGNOSIS — J101 Influenza due to other identified influenza virus with other respiratory manifestations: Secondary | ICD-10-CM | POA: Diagnosis not present

## 2024-08-06 LAB — POC SOFIA SARS ANTIGEN FIA: SARS Coronavirus 2 Ag: NEGATIVE

## 2024-08-06 LAB — POCT INFLUENZA A/B
Influenza A, POC: POSITIVE — AB
Influenza B, POC: NEGATIVE

## 2024-08-06 MED ORDER — ACETAMINOPHEN 325 MG PO TABS
650.0000 mg | ORAL_TABLET | Freq: Once | ORAL | Status: AC
  Administered 2024-08-06: 650 mg via ORAL

## 2024-08-06 MED ORDER — BENZONATATE 100 MG PO CAPS: 100.0000 mg | ORAL_CAPSULE | Freq: Three times a day (TID) | ORAL | 0 refills | Status: AC

## 2024-08-06 MED ORDER — OSELTAMIVIR PHOSPHATE 75 MG PO CAPS: 75.0000 mg | ORAL_CAPSULE | Freq: Two times a day (BID) | ORAL | 0 refills | Status: AC

## 2024-08-06 NOTE — ED Triage Notes (Signed)
 Pt presents c/o URI x 3 days. Pt states,  I feel miserable. My body hurts. I keep coughing.  Pt denies emesis and diarrhea.

## 2024-08-06 NOTE — ED Provider Notes (Signed)
 [facility]    CSN: [id number] Arrival date & time: 08/06/24  1033      History   Chief Complaint Chief Complaint  Patient presents with   URI    HPI Kelsey Cooper is a 33 y.o. female.   Pt presents today due to 3 days worth of body aches, chills, cough, nasal congestion, nasal drainage, fever (highest temp 101.9), and loss of appetite. Pt denies known sick contacts and denies use of OTC meds for symptoms.   The history is provided by the patient.  URI   Past Medical History:  Diagnosis Date   Anxiety    GERD (gastroesophageal reflux disease)    Severe depression (HCC)     Patient Active Problem List   Diagnosis Date Noted   Chronic pain 04/09/2024   Mixed anxiety and depressive disorder 04/09/2024   Presence of intrauterine contraceptive device 04/09/2024   Routine health maintenance 04/09/2024   Scar of skin 04/09/2024   Tobacco dependence syndrome 04/09/2024   Anemia of pregnancy 01/24/2024   Tobacco use disorder 01/23/2024   [redacted] weeks gestation of pregnancy 04/27/2017   Pregnancy 10/31/2016    Past Surgical History:  Procedure Laterality Date   NO PAST SURGERIES      OB History     Gravida  5   Para  3   Term  3   Preterm  0   AB  2   Living  3      SAB  1   IAB  1   Ectopic      Multiple  0   Live Births  3            Home Medications    Prior to Admission medications   Medication Sig Start Date End Date Taking? Authorizing Provider  Azelaic Acid 15 % gel Apply topically. 07/14/21  Yes [provider]  buPROPion (WELLBUTRIN XL) 150 MG 24 hr tablet Take 1 tablet every day by oral route. 06/22/17  Yes [provider]  buPROPion (WELLBUTRIN XL) 300 MG 24 hr tablet Take 1 tablet every day by oral route. 07/18/17  Yes [provider]  medroxyPROGESTERone (DEPO-PROVERA) 150 MG/ML injection Inject 1 mL every 3 months by intramuscular route as directed. 06/25/17  Yes [provider]   metroNIDAZOLE  (FLAGYL ) 500 MG tablet SMARTSIG:1 Tablet(s) By Mouth Every 12 Hours 05/05/24  Yes [provider]  norgestimate-ethinyl estradiol (ORTHO-CYCLEN) 0.25-35 MG-MCG tablet Take 1 tablet by mouth daily. 04/29/24  Yes [provider]  oseltamivir (TAMIFLU) 75 MG capsule Take 1 capsule (75 mg total) by mouth every 12 (twelve) hours. 08/06/24  Yes Andra Krabbe C, PA-C  BENADRYL  ITCH RELIEF STICK 2-0.1 % STCK Apply topically. 08/23/22   [provider]  ibuprofen  (ADVIL ) 600 MG tablet Take 1 tablet (600 mg total) by mouth every 6 (six) hours as needed. Patient not taking: Reported on 08/01/2022 05/03/20   Alvia Krabbe CROME, FNP  levonorgestrel (MIRENA, 52 MG,) 20 MCG/DAY IUD Mirena 20 mcg/24 hours (8 yrs) 52 mg intrauterine device  Take 1 device by intrauterine route.    [provider]  mupirocin  ointment (BACTROBAN ) 2 % Apply 1 application topically daily. Patient not taking: Reported on 08/01/2022 09/12/21   Raspet, Erin K, PA-C  oxycodone  (OXY-IR) 5 MG capsule     [provider]  predniSONE  (STERAPRED UNI-PAK 21 TAB) 10 MG (21) TBPK tablet Take by mouth daily. Take 6 tabs by mouth daily  for 21 days,  then 5 tabs for 1 days, then 4 tabs for 1 days, then 3 tabs for 1 days, 2 tabs for 1 days, then 1 tab by mouth daily for 1 days 08/30/22   Teresa Shelba SAUNDERS, NP    Family History Family History  Problem Relation Age of Onset   Diabetes Father    Hypertension Paternal Grandmother     Social History Social History   Tobacco Use   Smoking status: Former    Current packs/day: 0.00    Types: Cigarettes    Quit date: 2020    Years since quitting: 5.8    Passive exposure: Past   Smokeless tobacco: Former  Building Services Engineer status: Every Day  Substance Use Topics   Alcohol use: No   Drug use: No     Allergies   Patient has no known allergies.   Review of Systems Review of Systems   Physical Exam Triage Vital  Signs ED Triage Vitals  Encounter Vitals Group     BP 08/06/24 1209 127/76     Girls Systolic BP Percentile --      Girls Diastolic BP Percentile --      Boys Systolic BP Percentile --      Boys Diastolic BP Percentile --      Pulse Rate 08/06/24 1209 97     Resp 08/06/24 1209 18     Temp 08/06/24 1209 (!) 101.9 F (38.8 C)     Temp Source 08/06/24 1209 Oral     SpO2 08/06/24 1209 96 %     Weight 08/06/24 1209 143 lb 15.4 oz (65.3 kg)     Height --      Head Circumference --      Peak Flow --      Pain Score 08/06/24 1208 10     Pain Loc --      Pain Education --      Exclude from Growth Chart --    No data found.  Updated Vital Signs BP 127/76 (BP Location: Left Arm)   Pulse 97   Temp (!) 101.9 F (38.8 C) (Oral)   Resp 18   Wt 143 lb 15.4 oz (65.3 kg)   LMP  (LMP Unknown)   SpO2 96%   Breastfeeding No   BMI 27.20 kg/m   Visual Acuity Right Eye Distance:   Left Eye Distance:   Bilateral Distance:    Right Eye Near:   Left Eye Near:    Bilateral Near:     Physical Exam Vitals and nursing note reviewed.  Constitutional:      General: She is not in acute distress.    Appearance: Normal appearance. She is ill-appearing. She is not toxic-appearing or diaphoretic.  HENT:     Nose: Congestion (moderately enlarged turbinates) present. No rhinorrhea.     Mouth/Throat:     Mouth: Mucous membranes are moist.     Pharynx: Oropharynx is clear. No oropharyngeal exudate or posterior oropharyngeal erythema.  Eyes:     General: No scleral icterus. Cardiovascular:     Rate and Rhythm: Normal rate and regular rhythm.     Heart sounds: Normal heart sounds.  Pulmonary:     Effort: Pulmonary effort is normal. No respiratory distress.     Breath sounds: Normal breath sounds. No wheezing or rhonchi.  Skin:    General: Skin is warm.  Neurological:     Mental Status: She is alert and oriented to person, place, and time.  Psychiatric:        Mood and Affect: Mood normal.         Behavior: Behavior normal.      UC Treatments / Results  Labs (all labs ordered are listed, but only abnormal results are displayed) Labs Reviewed  POCT INFLUENZA A/B - Abnormal; Notable for the following components:      Result Value   Influenza A, POC Positive (*)    All other components within normal limits  POC SOFIA SARS ANTIGEN FIA    EKG   Radiology No results found.  Procedures Procedures (including critical care time)  Medications Ordered in UC Medications  acetaminophen  (TYLENOL ) tablet 650 mg (650 mg Oral Given 08/06/24 1215)    Initial Impression / Assessment and Plan / UC Course  I have reviewed the triage vital signs and the nursing notes.  Pertinent labs & imaging results that were available during my care of the patient were reviewed by me and considered in my medical decision making (see chart for details).    Final Clinical Impressions(s) / UC Diagnoses   Final diagnoses:  Influenza A     Discharge Instructions      You been diagnosed with a viral illness today. -Viruses have to run their course and medicines that are prescribed are meant to help with symptoms. - With viruses usually feel poorly from 3 to 7 days with cough being the last symptoms to resolve.  -Cough can linger from days to weeks.  Antibiotics are not effective for viruses. -If your cough lasts more than 2 weeks and you are coughing so hard that you are vomiting or feel like you could pass out we need to follow-up with PCP for further testing and evaluation. -Rest, increase water intake, may use pseudoephedrine for nasal congestion, Delsym (dextromethorphan) or honey as needed for cough, and ibuprofen  and/or Tylenol  as directed on packaging for pain and fever. -If you have hypertension you should take Coricidin or other OTC meds approved for people with high blood pressure. -You may use a spoonful of honey every 4-6 hours as needed for throat pain and cough. -Warm tea with  honey and lemon are helpful for soothe throat as well.  Chloraseptic and Cepacol make a throat lozenge with numbing medication, can be purchased over-the-counter. -May also use Flonase or sinus rinse for sinus pressure or nasal congestion.  Be sure to use distilled bottled water for sinus rinses. -May use coolmist humidifier to open up nasal passages -May elevate head to assist with postnasal drainage. -If you feel poorly (fever, fatigue, shortness of breath, nausea, etc.) for more than 10 days to be sure to follow-up with PCP or in clinic for further evaluation and additional treatments. If you experience chest pain with shortness of breath or pulse oxygen less than 95% you should report to the ER.     ED Prescriptions     Medication Sig Dispense Auth. Provider   oseltamivir (TAMIFLU) 75 MG capsule Take 1 capsule (75 mg total) by mouth every 12 (twelve) hours. 10 capsule Andra Corean BROCKS, PA-C      PDMP not reviewed this encounter.   Andra Corean BROCKS, PA-C 08/06/24 1249

## 2024-08-06 NOTE — Discharge Instructions (Addendum)

## 2024-08-19 ENCOUNTER — Ambulatory Visit: Admitting: Physical Therapy

## 2024-09-01 ENCOUNTER — Ambulatory Visit: Attending: Physician Assistant | Admitting: Physical Therapy

## 2024-09-01 ENCOUNTER — Encounter: Payer: Self-pay | Admitting: Physical Therapy

## 2024-09-01 DIAGNOSIS — M542 Cervicalgia: Secondary | ICD-10-CM

## 2024-09-01 DIAGNOSIS — R293 Abnormal posture: Secondary | ICD-10-CM

## 2024-09-01 DIAGNOSIS — M6281 Muscle weakness (generalized): Secondary | ICD-10-CM

## 2024-09-01 DIAGNOSIS — M545 Low back pain, unspecified: Secondary | ICD-10-CM | POA: Diagnosis present

## 2024-09-01 NOTE — Therapy (Addendum)
 PHYSICAL THERAPY DISCHARGE SUMMARY  Visits from Start of Care: 4  Current functional level related to goals / functional outcomes: See assessment/goals   Remaining deficits: See assessment/goals   Education / Equipment: HEP and D/C plans  Patient agrees to discharge. Patient goals were met. Patient is being discharged due to meeting the stated rehab goals.   Patient Name: Kelsey Cooper MRN: [id number] DOB:10-17-1990, 33 y.o., female Today's Date: 09/01/2024  END OF SESSION:  PT End of Session - 09/01/24 1331     Visit Number 4    Number of Visits 16    Date for Recertification  09/07/24    Authorization Type [employer] BLUE    Authorization Time Period 5 visits approved 07/08/24-09/05/24    Authorization - Visit Number 2    Authorization - Number of Visits 5    PT Start Time 1330    PT Stop Time 1344    PT Time Calculation (min) 14 min    Activity Tolerance Patient tolerated treatment well    Behavior During Therapy [facility] for tasks assessed/performed            Past Medical History:  Diagnosis Date   Anxiety    GERD (gastroesophageal reflux disease)    Severe depression (HCC)    Past Surgical History:  Procedure Laterality Date   NO PAST SURGERIES     Patient Active Problem List   Diagnosis Date Noted   Chronic pain 04/09/2024   Mixed anxiety and depressive disorder 04/09/2024   Presence of intrauterine contraceptive device 04/09/2024   Routine health maintenance 04/09/2024   Scar of skin 04/09/2024   Tobacco dependence syndrome 04/09/2024   Anemia of pregnancy 01/24/2024   Tobacco use disorder 01/23/2024   [redacted] weeks gestation of pregnancy 04/27/2017   Pregnancy 10/31/2016    PCP: Rosalea Rosina SAILOR, PA PCP - General   REFERRING PROVIDER: Theo Eleanor PARAS, PA-C   REFERRING DIAG: M54.2 (ICD-10-CM) - Cervicalgia   THERAPY DIAG:  Cervicalgia  Muscle weakness  Abnormal posture  Low back pain, unspecified back pain laterality, unspecified  chronicity, unspecified whether sciatica present  Rationale for Evaluation and Treatment: Rehabilitation  ONSET DATE: chronic  SUBJECTIVE:                                                                                                                                                                                                         SUBJECTIVE STATEMENT:  Reports feeling better each session. Some tightness in back today still. Been compliant with HEP.  EVAL: Pain was getting better, but had  to stop therapy. 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   PERTINENT HISTORY:  Been to therapy before, had issues with therapy auth and required new referral.  PAIN:  4.5C, 10W   Are you having pain? Yes: NPRS scale: 4/10 Pain location: Upper neck, spine Pain description: dull Aggravating factors: overwork, driving for long times Relieving factors: rest  PRECAUTIONS: None  RED FLAGS: None     WEIGHT BEARING RESTRICTIONS: No  FALLS:  Has patient fallen in last 6 months? No   OCCUPATION: cleaner  PLOF: Independent  PATIENT GOALS: ease pain, become independent with HEP    OBJECTIVE:  Note: Objective measures were completed at Evaluation unless otherwise noted.    PATIENT SURVEYS:  NDI:  NECK DISABILITY INDEX  Date: 10/21 Score  Pain intensity   2. Personal care (washing, dressing, etc.)   3. Lifting   4. Reading   5. Headaches   6. Concentration   7. Work   8. Driving   9. Sleeping   10. Recreation   Total 17/50   Minimum Detectable Change (90% confidence): 5 points or 10% points  COGNITION: Overall cognitive status: Within functional limits for tasks assessed  SENSATION: WFL  POSTURE: rounded shoulders and forward head  PALPATION: TTP Cspine erectors, BL UT and MT   CERVICAL ROM:   Active ROM A/PROM (deg) eval  Flexion WFL all p! (Tight)  Extension   Right lateral flexion   Left lateral flexion    Right rotation   Left rotation    (Blank rows = not tested)  UPPER EXTREMITY ROM:  Active ROM Right eval Left eval  Shoulder flexion WFL R shoulder functional movements WFL L shoulder functional movements  Shoulder extension    Shoulder abduction    Shoulder adduction    Shoulder extension    Shoulder internal rotation    Shoulder external rotation    Elbow flexion    Elbow extension    Wrist flexion    Wrist extension    Wrist ulnar deviation    Wrist radial deviation    Wrist pronation    Wrist supination     (Blank rows = not tested)  UPPER EXTREMITY MMT:  MMT Right eval Left eval  Shoulder flexion 4- 4-  Shoulder extension    Shoulder abduction 4- 4-  Shoulder adduction    Shoulder extension    Shoulder internal rotation    Shoulder external rotation    upper trapezius 4 4  Lower trapezius    Elbow flexion    Elbow extension    Wrist flexion    Wrist extension    Wrist ulnar deviation    Wrist radial deviation    Wrist pronation    Wrist supination    Grip strength     (Blank rows = not tested)  CERVICAL SPECIAL TESTS:  Spurling's test: Negative    TREATMENT   TREATMENT 09/01/2024:  Therapeutic Activity  collecting information for goals, checking progress, and reviewing with patient  TREATMENT 07/18/2024:  Therapeutic Exercise: HEP review and update Seated shrug 2x8x3s Seated scap retraction 2x8x3s Seated chin tuck 2x8x3s Seated Y's 2x6 Standing YTB row 2x6x3s Standing pullover YTB 2x4x3s 5lb kb shrugs 8x3s    Manual Therapy: STM BL UT, MT, lat, Thoracolumbar PS        [facility] Adult PT Treatment:  DATE: 07/15/24 Therapeutic Exercise: Shoulder shrugs x10 Shoulder rolls fwd/bwd x10 ea Seated ITYs no weight x10 ea Seated upper stretch 2x30 BIL Neuromuscular re-ed: Seated scapular retraction 2x10 Seated chin tucks 3 hold 2x10 Seated BIL ER with scap retraction RTB 2x10 Seated  horizontal abduction RTB 2x10 Self Care: Theracane self instruction, tack and stretch, and MTPR  DATE:                                                                                                                                TREATMENT 07/08/2024:  Therapeutic Exercise: Supine chin tuck 2x8x3s Supine cervical rotation 2x8x3s Seated scapular retraction 2x8x3s  Self-care/Home Management: Patient educated on HEP, POC, prognosis, and relevant tissues/anatomy.    PATIENT EDUCATION:  Education details: HEP Person educated: Patient Education method: Programmer, Multimedia, Facilities Manager, Actor cues, Verbal cues, and Handouts Education comprehension: verbalized understanding and returned demonstration  HOME EXERCISE PROGRAM: Access Code: [email or web address] URL: [email or web address] Date: 07/08/2024 Prepared by: [name]  Exercises - Supine Cervical Rotation AROM on Pillow  - 2 x daily - 5 x weekly - 2 sets - 8 reps - 3 hold - Supine Chin Tuck  - 2 x daily - 5 x weekly - 2 sets - 8 reps - 3 hold - Seated Scapular Retraction  - 2 x daily - 5 x weekly - 2 sets - 8 reps - 3 hold  ASSESSMENT:  CLINICAL IMPRESSION: Upon goal recheck pt has met all short and long term goals established at evaluation.  She rates her average neck pain 4/10.  NDI shows low disability.  Will d/c home with HEP.   EVAL: Patient is a 33 year old female who presents with chronic uppre back pain. Patient presents with deficits in: strength, activity tolerance, and excessive pain. As a result, the patient would benefit from skilled PT to address aforementioned deficits via plan below.   OBJECTIVE IMPAIRMENTS: decreased strength, postural dysfunction, and pain.    PERSONAL FACTORS: Profession and Time since onset of injury/illness/exacerbation are also affecting patient's functional outcome.   REHAB POTENTIAL: Good  CLINICAL DECISION MAKING: Stable/uncomplicated  EVALUATION COMPLEXITY:  Moderate   GOALS: Goals reviewed with patient? No  SHORT TERM GOALS: Target date: 07/29/2024   Patient will demonstrate 75% HEP compliance to show independence with self-management of condition  Baseline: 0% 12/15: MET Goal status: MET  2.  Patient will decrease worst pain to 8/10 at most to improve ADL completion and overall QOL  Baseline: 10/10 12/15: 4/10 Goal status: MET    LONG TERM GOALS: Target date: 09/07/2024    Patient will demonstrate a 9 point improvement in NDI to show improvements in ADL completion and overall QOL   Baseline: 17/50 12/15: 7/50 Goal status: MET  2.  Patient will decrease worst pain to 4/10 at most to improve ADL completion and overall QOL  Baseline: 10/10 12/15: 4/10 MET Goal status: MET  3.  Patient will demonstrate 100% HEP compliance  to show independence with self-management of condition  Baseline: 0% 12/15: MET Goal status: MET    PLAN:  PT FREQUENCY: 1-2x/week  PT DURATION: 8 weeks  PLANNED INTERVENTIONS: 97110-Therapeutic exercises, 97530- Therapeutic activity, 97112- Neuromuscular re-education, 97535- Self Care, [id number]- Manual therapy, and Patient/Family education  PLAN FOR NEXT SESSION: HEP assessment and progression, symptom modulation, and loading (isolated and/or functional). Manual therapy and NME training as needed.     Kelsey Cooper PT  09/01/2024 2:11 PM

## 2024-09-28 ENCOUNTER — Emergency Department (HOSPITAL_COMMUNITY): Admission: EM | Admit: 2024-09-28 | Discharge: 2024-09-28 | Discharge status: Against Medical Advice

## 2024-09-28 ENCOUNTER — Emergency Department (HOSPITAL_BASED_OUTPATIENT_CLINIC_OR_DEPARTMENT_OTHER)

## 2024-09-28 ENCOUNTER — Encounter (HOSPITAL_BASED_OUTPATIENT_CLINIC_OR_DEPARTMENT_OTHER): Payer: Self-pay | Admitting: *Deleted

## 2024-09-28 ENCOUNTER — Other Ambulatory Visit: Payer: Self-pay

## 2024-09-28 ENCOUNTER — Emergency Department (HOSPITAL_BASED_OUTPATIENT_CLINIC_OR_DEPARTMENT_OTHER)
Admission: EM | Admit: 2024-09-28 | Discharge: 2024-09-28 | Disposition: A | Discharge status: Home / Self Care | Attending: Emergency Medicine | Admitting: Emergency Medicine

## 2024-09-28 DIAGNOSIS — N6310 Unspecified lump in the right breast, unspecified quadrant: Secondary | ICD-10-CM | POA: Diagnosis present

## 2024-09-28 DIAGNOSIS — N61 Mastitis without abscess: Secondary | ICD-10-CM | POA: Insufficient documentation

## 2024-09-28 MED ORDER — CEPHALEXIN 500 MG PO CAPS: 500.0000 mg | ORAL_CAPSULE | Freq: Four times a day (QID) | ORAL | 0 refills | Status: DC

## 2024-09-28 MED ORDER — CEPHALEXIN 500 MG PO CAPS: 500.0000 mg | ORAL_CAPSULE | Freq: Four times a day (QID) | ORAL | 0 refills | Status: AC

## 2024-09-28 MED ORDER — DOXYCYCLINE HYCLATE 100 MG PO CAPS: 100.0000 mg | ORAL_CAPSULE | Freq: Two times a day (BID) | ORAL | 0 refills | Status: DC

## 2024-09-28 MED ORDER — DOXYCYCLINE HYCLATE 100 MG PO CAPS: 100.0000 mg | ORAL_CAPSULE | Freq: Two times a day (BID) | ORAL | 0 refills | Status: AC

## 2024-09-28 NOTE — Discharge Instructions (Addendum)
 Follow-up with your primary care doctor.

## 2024-09-28 NOTE — ED Triage Notes (Signed)
 Pt woke up yesterday morning with pain and swelling to right breast.  Pt is not breastfeeding.  No injury.  Pt states that it has been getting worse.  No fever or chills, breast is warm to touch.

## 2024-09-28 NOTE — ED Notes (Signed)
 Pt said the wait is to long and has left

## 2024-09-28 NOTE — ED Provider Notes (Signed)
 " [place] [facility] AT [facility] Provider Note   CSN: [id number] Arrival date & time: 09/28/24  1535     Patient presents with: Breast Problem   Kelsey Cooper is a 34 y.o. female.   34 year old female presents with right breast swelling x 1 day.  Notes that she has not had any nipple discharge.  No fever or chills with no prior history of breast issues.  No history in her family of breast cancer.  No pain in her right axilla.  States that it became warm and tender.  No fever or chills       Prior to Admission medications  Medication Sig Start Date End Date Taking? Authorizing Provider  Azelaic Acid 15 % gel Apply topically. 07/14/21   [provider]  BENADRYL  ITCH RELIEF STICK 2-0.1 % STCK Apply topically. 08/23/22   [provider]  benzonatate  (TESSALON ) 100 MG capsule Take 1 capsule (100 mg total) by mouth every 8 (eight) hours. 08/06/24   Andra Corean BROCKS, PA-C  buPROPion (WELLBUTRIN XL) 150 MG 24 hr tablet Take 1 tablet every day by oral route. 06/22/17   [provider]  buPROPion (WELLBUTRIN XL) 300 MG 24 hr tablet Take 1 tablet every day by oral route. 07/18/17   [provider]  ibuprofen  (ADVIL ) 600 MG tablet Take 1 tablet (600 mg total) by mouth every 6 (six) hours as needed. Patient not taking: Reported on 08/01/2022 05/03/20   Alvia Corean CROME, FNP  levonorgestrel (MIRENA, 52 MG,) 20 MCG/DAY IUD Mirena 20 mcg/24 hours (8 yrs) 52 mg intrauterine device  Take 1 device by intrauterine route.    [provider]  medroxyPROGESTERone (DEPO-PROVERA) 150 MG/ML injection Inject 1 mL every 3 months by intramuscular route as directed. 06/25/17   [provider]  metroNIDAZOLE  (FLAGYL ) 500 MG tablet SMARTSIG:1 Tablet(s) By Mouth Every 12 Hours 05/05/24   [provider]  mupirocin  ointment (BACTROBAN ) 2 % Apply 1 application topically daily. Patient not taking: Reported on 08/01/2022  09/12/21   Raspet, Erin K, PA-C  norgestimate-ethinyl estradiol (ORTHO-CYCLEN) 0.25-35 MG-MCG tablet Take 1 tablet by mouth daily. 04/29/24   [provider]  oseltamivir  (TAMIFLU ) 75 MG capsule Take 1 capsule (75 mg total) by mouth every 12 (twelve) hours. 08/06/24   Andra Corean BROCKS, PA-C  oxycodone  (OXY-IR) 5 MG capsule     [provider]  predniSONE  (STERAPRED UNI-PAK 21 TAB) 10 MG (21) TBPK tablet Take by mouth daily. Take 6 tabs by mouth daily  for 21 days, then 5 tabs for 1 days, then 4 tabs for 1 days, then 3 tabs for 1 days, 2 tabs for 1 days, then 1 tab by mouth daily for 1 days 08/30/22   Teresa Shelba SAUNDERS, NP    Allergies: Patient has no known allergies.    Review of Systems  All other systems reviewed and are negative.   Updated Vital Signs BP 120/68 (BP Location: Left Arm)   Pulse 86   Temp 98.6 F (37 C)   Resp 14   SpO2 100%   Physical Exam Vitals and nursing note reviewed. Exam conducted with a chaperone present.  Constitutional:      General: She is not in acute distress.    Appearance: Normal appearance. She is well-developed. She is not toxic-appearing.  HENT:     Head: Normocephalic and atraumatic.  Eyes:     General: Lids are normal.     Conjunctiva/sclera: Conjunctivae normal.  Pupils: Pupils are equal, round, and reactive to light.  Neck:     Thyroid: No thyroid mass.     Trachea: No tracheal deviation.  Cardiovascular:     Rate and Rhythm: Normal rate and regular rhythm.     Heart sounds: Normal heart sounds. No murmur heard.    No gallop.  Pulmonary:     Effort: Pulmonary effort is normal. No respiratory distress.     Breath sounds: Normal breath sounds. No stridor. No decreased breath sounds, wheezing, rhonchi or rales.  Chest:  Breasts:    Right: Swelling, mass and tenderness present. No bleeding or nipple discharge.     Left: Normal.  Abdominal:     General: There is no distension.     Palpations: Abdomen is soft.      Tenderness: There is no abdominal tenderness. There is no rebound.  Musculoskeletal:        General: No tenderness. Normal range of motion.     Cervical back: Normal range of motion and neck supple.  Lymphadenopathy:     Upper Body:     Right upper body: No axillary adenopathy.  Skin:    General: Skin is warm and dry.     Findings: No abrasion or rash.  Neurological:     Mental Status: She is alert and oriented to person, place, and time. Mental status is at baseline.     GCS: GCS eye subscore is 4. GCS verbal subscore is 5. GCS motor subscore is 6.     Cranial Nerves: No cranial nerve deficit.     Sensory: No sensory deficit.     Motor: Motor function is intact.  Psychiatric:        Attention and Perception: Attention normal.        Speech: Speech normal.        Behavior: Behavior normal.     (all labs ordered are listed, but only abnormal results are displayed) Labs Reviewed - No data to display  EKG: None  Radiology: No results found.   Procedures   Medications Ordered in the ED - No data to display                                  Medical Decision Making Amount and/or Complexity of Data Reviewed Radiology: ordered.   Ultrasound of patient's right breast consistent with cellulitis.  No evidence of abscess.  Will place on antibiotics and she will follow-up with her doctor     Final diagnoses:  None    ED Discharge Orders     None          Dasie Faden, MD 09/28/24 1821  "

## 2024-10-08 ENCOUNTER — Other Ambulatory Visit: Payer: Self-pay | Admitting: Physician Assistant

## 2024-10-08 DIAGNOSIS — N6310 Unspecified lump in the right breast, unspecified quadrant: Secondary | ICD-10-CM

## 2024-10-08 DIAGNOSIS — N61 Mastitis without abscess: Secondary | ICD-10-CM

## 2024-10-08 DIAGNOSIS — N644 Mastodynia: Secondary | ICD-10-CM

## 2024-10-15 ENCOUNTER — Ambulatory Visit
Admission: RE | Admit: 2024-10-15 | Discharge: 2024-10-15 | Disposition: A | Source: Ambulatory Visit | Attending: Physician Assistant | Admitting: Physician Assistant

## 2024-10-15 ENCOUNTER — Other Ambulatory Visit: Payer: Self-pay | Admitting: Physician Assistant

## 2024-10-15 ENCOUNTER — Other Ambulatory Visit

## 2024-10-15 ENCOUNTER — Encounter

## 2024-10-15 ENCOUNTER — Encounter: Payer: Self-pay | Admitting: Physician Assistant

## 2024-10-15 DIAGNOSIS — N644 Mastodynia: Secondary | ICD-10-CM

## 2024-10-15 DIAGNOSIS — N6310 Unspecified lump in the right breast, unspecified quadrant: Secondary | ICD-10-CM

## 2024-10-17 ENCOUNTER — Other Ambulatory Visit

## 2024-10-17 ENCOUNTER — Encounter

## 2024-10-29 ENCOUNTER — Other Ambulatory Visit
